# Patient Record
Sex: Female | Born: 1935 | Race: Black or African American | Hispanic: No | Smoking: Never smoker
Health system: Southern US, Community
[De-identification: ages and names within clinical notes are randomized; demographics above are authoritative.]

## PROBLEM LIST (undated history)

## (undated) DIAGNOSIS — K5730 Diverticulosis of large intestine without perforation or abscess without bleeding: Secondary | ICD-10-CM

## (undated) DIAGNOSIS — K219 Gastro-esophageal reflux disease without esophagitis: Secondary | ICD-10-CM

## (undated) HISTORY — PX: OOPHORECTOMY: SHX86

## (undated) HISTORY — DX: Diverticulosis of large intestine without perforation or abscess without bleeding: K57.30

## (undated) HISTORY — PX: ABDOMINAL HYSTERECTOMY: SHX81

## (undated) HISTORY — DX: Gastro-esophageal reflux disease without esophagitis: K21.9

---

## 2004-06-14 ENCOUNTER — Ambulatory Visit: Payer: Self-pay | Admitting: Internal Medicine

## 2004-07-09 ENCOUNTER — Ambulatory Visit: Payer: Self-pay | Admitting: Internal Medicine

## 2005-01-25 ENCOUNTER — Ambulatory Visit: Payer: Self-pay | Admitting: Internal Medicine

## 2005-10-07 ENCOUNTER — Ambulatory Visit: Payer: Self-pay | Admitting: Family

## 2005-11-19 ENCOUNTER — Ambulatory Visit: Payer: Self-pay | Admitting: Family Medicine

## 2005-12-30 ENCOUNTER — Ambulatory Visit: Payer: Self-pay | Admitting: Unknown Physician Specialty

## 2006-12-01 ENCOUNTER — Ambulatory Visit: Payer: Self-pay

## 2007-01-29 ENCOUNTER — Emergency Department: Payer: Self-pay | Admitting: Emergency Medicine

## 2007-01-29 ENCOUNTER — Other Ambulatory Visit: Payer: Self-pay

## 2007-03-16 ENCOUNTER — Ambulatory Visit: Payer: Self-pay | Admitting: Ophthalmology

## 2007-10-19 ENCOUNTER — Ambulatory Visit: Payer: Self-pay | Admitting: Internal Medicine

## 2008-01-11 ENCOUNTER — Ambulatory Visit: Payer: Self-pay | Admitting: Internal Medicine

## 2008-02-25 ENCOUNTER — Emergency Department: Payer: Self-pay | Admitting: Emergency Medicine

## 2008-02-25 ENCOUNTER — Other Ambulatory Visit: Payer: Self-pay

## 2008-04-04 ENCOUNTER — Ambulatory Visit: Payer: Self-pay | Admitting: Gastroenterology

## 2008-07-18 ENCOUNTER — Ambulatory Visit: Payer: Self-pay | Admitting: Internal Medicine

## 2008-09-28 ENCOUNTER — Emergency Department: Payer: Self-pay | Admitting: Emergency Medicine

## 2008-11-14 ENCOUNTER — Ambulatory Visit: Payer: Self-pay | Admitting: Internal Medicine

## 2008-11-28 ENCOUNTER — Ambulatory Visit: Payer: Self-pay | Admitting: Internal Medicine

## 2009-01-04 ENCOUNTER — Ambulatory Visit (HOSPITAL_COMMUNITY): Admission: RE | Admit: 2009-01-04 | Discharge: 2009-01-05 | Payer: Self-pay | Admitting: Neurosurgery

## 2009-04-03 ENCOUNTER — Ambulatory Visit: Payer: Self-pay | Admitting: Internal Medicine

## 2009-04-04 ENCOUNTER — Ambulatory Visit: Payer: Self-pay | Admitting: Internal Medicine

## 2009-04-24 ENCOUNTER — Encounter: Payer: Self-pay | Admitting: Internal Medicine

## 2009-05-21 ENCOUNTER — Encounter: Payer: Self-pay | Admitting: Internal Medicine

## 2010-06-07 ENCOUNTER — Ambulatory Visit: Payer: Self-pay | Admitting: Internal Medicine

## 2010-09-20 LAB — IFOBT (OCCULT BLOOD): IFOBT: NEGATIVE

## 2010-10-21 ENCOUNTER — Ambulatory Visit: Payer: Self-pay | Admitting: Gastroenterology

## 2010-10-21 LAB — HM COLONOSCOPY

## 2010-10-28 LAB — CBC
HCT: 39.4 % (ref 36.0–46.0)
Hemoglobin: 13.5 g/dL (ref 12.0–15.0)
MCHC: 34.2 g/dL (ref 30.0–36.0)
MCV: 91 fL (ref 78.0–100.0)
Platelets: 383 10*3/uL (ref 150–400)
RBC: 4.33 MIL/uL (ref 3.87–5.11)
RDW: 13.1 % (ref 11.5–15.5)
WBC: 7.7 10*3/uL (ref 4.0–10.5)

## 2010-11-04 ENCOUNTER — Ambulatory Visit: Payer: Self-pay | Admitting: Gastroenterology

## 2010-12-03 NOTE — Op Note (Signed)
NAME:  Bridget [name], [name] NO.:  [id number]   MEDICAL RECORD NO.:  [id number]          PATIENT TYPE:  OIB   LOCATION:  3599                         FACILITY:  [facility]   PHYSICIAN:  Hewitt Shorts, M.D.DATE OF BIRTH:  [birth year]   DATE OF PROCEDURE:  01/04/2009  DATE OF DISCHARGE:                               OPERATIVE REPORT   PREOPERATIVE DIAGNOSES:  1. Lumbar stenosis.  2. Lumbar spondylosis.  3. Lumbar degenerative disk disease.  4. Neurogenic claudication.   POSTOPERATIVE DIAGNOSES:  1. Lumbar stenosis.  2. Lumbar spondylosis.  3. Lumbar degenerative disk disease.  4. Neurogenic claudication.   PROCEDURES:  L3-S1 decompressive lumbar laminotomy with microdissection  with decompression of the L3, L4, L5, and S1 nerve roots bilaterally.   SURGEON:  Hewitt Shorts, MD   ASSISTANT:  Nelia Shi. Webb Silversmith, RN and Coletta Memos, MD   ANESTHESIA:  General endotracheal.   INDICATIONS:  The patient is a 77 year old woman who presented with  neurogenic claudication.  She was found to have 6 lumbar type vertebra,  thoracic x-ray confirmed lumbarization of S1.  She had advanced  degenerative disk disease of spondylosis to the lumbar spine with  resulting stenosis at L3-4, L4-5, and L5-S1 multifactorial nature, she  had a mild grade 1 degenerative spondylolisthesis of L4 and L5, and the  decision was made to proceed with decompressive lumbar laminectomy.   PROCEDURE IN DETAIL:  The patient was brought to the operating room and  placed under general endotracheal anesthesia.  The patient was turned to  a prone position.  Lumbar region was prepped with Betadine soap and  solution and draped in a sterile fashion.  The midline was infiltrated  with local anesthetic with epinephrine and midline incision made carried  down through the subcutaneous tissue.  Bipolar cautery and  electrocautery were used to maintain hemostasis.  Dissection was carried  down to the lumbar  fascia, which was incised bilaterally, and the  paraspinal musculature was dissected from the spinous process in a  subperiosteal fashion.  Self-retaining retractors were placed and an x-  ray taken.  The L3, L4, and L5 spinous process and lamina were  identified.   We proceeded with decompression using magnification with microdissection  and microsurgical technique.  Laminectomy was performed using double-  action rongeurs, the Stryker high-speed drill, and Kerrison punches.  There was marked thickening of the ligamentum flavum at each level at  several points was adherent to the dura and was carefully dissected from  the dural adhesions and the thickened ligamentum flavum was removed and  the thecal sac decompressed.  Decompression was carried out laterally  into the neuroforamen using up-angled curettes to scrap the thickened  ligamentum flavum away from this lateral thecal sac and nerve roots.  Then, we were able to decompress the exceeding L3, L4, L5, and S1 nerve  roots bilaterally.  Once the decompression was completed, hemostasis was  established.  We placed Gelfoam with thrombin in the laminectomy defect.  The wound was irrigated numerous times during the procedure initially  with saline solution and subsequently with Bacitracin  solution.  Good  hemostasis was achieved and then once decompression was completed,  hemostasis was established, we proceeded with closure.  Paraspinal  musculature was approximated with interrupted undyed 1 Vicryl sutures.  The deep fascia was closed with interrupted undyed 1 Vicryl suture.  Scarpa fascia was closed with interrupted undyed 1 Vicryl sutures.  The  subcutaneous and subcuticular were closed with interrupted inverted 2-0  undyed Vicryl sutures.  The skin edges were closed with surgical  staples.  The wound was dressed with Adaptic and sterile gauze.  The  procedure was tolerated well.  The estimated blood loss was 100 mL.  Sponge and needle  count were correct.  Following surgery, the patient  was returned back to the supine position, to be reversed from the  anesthetic, extubated, and transferred to the recovery room for further  care.  In the recovery room, she was noted to be moving all 4  extremities to command.      Hewitt Shorts, M.D.  Electronically Signed     RWN/MEDQ  D:  01/04/2009  T:  01/05/2009  Job:  [id number]

## 2011-03-04 ENCOUNTER — Encounter: Payer: Self-pay | Admitting: Internal Medicine

## 2011-03-31 ENCOUNTER — Telehealth: Payer: Self-pay | Admitting: Internal Medicine

## 2011-03-31 DIAGNOSIS — M8580 Other specified disorders of bone density and structure, unspecified site: Secondary | ICD-10-CM

## 2011-03-31 NOTE — Telephone Encounter (Signed)
Pt walked in wanting to get a bone density done she said the last time she saw dr Darrick Huntsman at [facility] dr Darrick Huntsman wanted her to get one.

## 2011-04-01 NOTE — Telephone Encounter (Signed)
dexa scan ordered but not printed.  Can you print?

## 2011-04-01 NOTE — Telephone Encounter (Signed)
Patient is requesting her bone density to be ordered.  She stated at her last office visit you told her it was time to have one.  Please advise

## 2011-04-02 ENCOUNTER — Other Ambulatory Visit (INDEPENDENT_AMBULATORY_CARE_PROVIDER_SITE_OTHER): Payer: Medicare Other | Admitting: *Deleted

## 2011-04-02 ENCOUNTER — Telehealth: Payer: Self-pay | Admitting: *Deleted

## 2011-04-02 DIAGNOSIS — E559 Vitamin D deficiency, unspecified: Secondary | ICD-10-CM

## 2011-04-02 DIAGNOSIS — E785 Hyperlipidemia, unspecified: Secondary | ICD-10-CM

## 2011-04-02 NOTE — Telephone Encounter (Signed)
Message copied by Jobie Quaker on Wed Apr 02, 2011  5:42 PM ------      Message from: Melody Comas L      Created: Wed Apr 02, 2011  8:30 AM      Regarding: Labs       Patient came in this morning for labs. She says that she had her cpx at [facility] and was told to come here for labs. She says that you were wanting to check her cholesterol and kidney function. She is not a diabetic. What would you like for me to order?

## 2011-04-02 NOTE — Telephone Encounter (Signed)
Yes, order has been printed and waiting on signature.

## 2011-04-03 NOTE — Telephone Encounter (Signed)
Ordered

## 2011-04-03 NOTE — Telephone Encounter (Signed)
She needs fasting lipids, vitam D 25 ohD and CMET  Use 272.4  268.9 and V58.60 for the codes,.  Thank you

## 2011-04-04 LAB — COMPREHENSIVE METABOLIC PANEL
ALT: 13 U/L (ref 0–35)
AST: 20 U/L (ref 0–37)
Albumin: 4.3 g/dL (ref 3.5–5.2)
Alkaline Phosphatase: 57 U/L (ref 39–117)
BUN: 10 mg/dL (ref 6–23)
Calcium: 9.7 mg/dL (ref 8.4–10.5)
Chloride: 105 mEq/L (ref 96–112)
Potassium: 4.2 mEq/L (ref 3.5–5.3)
Sodium: 142 mEq/L (ref 135–145)
Total Protein: 6.9 g/dL (ref 6.0–8.3)

## 2011-04-04 LAB — LIPID PANEL
LDL Cholesterol: 100 mg/dL — ABNORMAL HIGH (ref 0–99)
Triglycerides: 87 mg/dL (ref <150)
VLDL: 17 mg/dL (ref 0–40)

## 2011-04-07 ENCOUNTER — Encounter: Payer: Self-pay | Admitting: Internal Medicine

## 2011-05-13 ENCOUNTER — Telehealth: Payer: Self-pay | Admitting: *Deleted

## 2011-05-13 NOTE — Telephone Encounter (Signed)
Spoke w/patient. She c/o chest pain in her "breast bone" off and x 3 to 4 days. Recently treated by UC for sinusitis and given antibiotic which she finished a few days ago. She also has non-productive cough. Pain is relieved when she puts pressure on the area. NO fever, chills, body aches, wheezing, SOB, chest pressure, lightheadedness/dizziness or any other complaints. Advised ER or call and speak w/on call service with increase/change in symptoms - pt agreed. She is scheduled for OV tomorrow AM for eval.

## 2011-05-15 ENCOUNTER — Ambulatory Visit (INDEPENDENT_AMBULATORY_CARE_PROVIDER_SITE_OTHER): Payer: Medicare Other | Admitting: Internal Medicine

## 2011-05-15 ENCOUNTER — Encounter: Payer: Self-pay | Admitting: Internal Medicine

## 2011-05-15 DIAGNOSIS — R079 Chest pain, unspecified: Secondary | ICD-10-CM | POA: Insufficient documentation

## 2011-05-15 DIAGNOSIS — J0110 Acute frontal sinusitis, unspecified: Secondary | ICD-10-CM | POA: Insufficient documentation

## 2011-05-15 MED ORDER — ESOMEPRAZOLE MAGNESIUM 40 MG PO CPDR: 40.0000 mg | DELAYED_RELEASE_CAPSULE | Freq: Every day | ORAL | Status: DC

## 2011-05-15 MED ORDER — AMOXICILLIN-POT CLAVULANATE 875-125 MG PO TABS: 1.0000 | ORAL_TABLET | Freq: Two times a day (BID) | ORAL | Status: AC

## 2011-05-15 NOTE — Patient Instructions (Addendum)
Ty using OTC Lotrimin on a q tip to clean your ears daily.  Tell Merton Border to get Debrox (Liquid ear drop for eax removal) to use every night.  We are going to treat your sinuses with an antibiotic and your reflux with Nexium . Marland Kitchen  If your chest pain does  not resolve in a week,  We will set you up for a stress test.  Try Delsym cough syrup for your cough

## 2011-05-15 NOTE — Progress Notes (Signed)
  Subjective:    Patient ID: Bridget [name], female    DOB: Oct 06, 1930, 77 y.o.   MRN: [id number]  HPI  77 yo AA female with history of  GERD, osteopenia, presents with history of chest pain which started 2 or 3 weeks ago concurrent with sinus congestion and occasional cough productive of yellow sputum.  No fevers, or SOB.  Pain comes and goes eases off with belching or rubbing her chest/sternum.  No pleurisy or pain with cough.  She had one episode of sharp chest pain which radiated to back, which occurred while at rest.  She denies any episodes of chest pain  During exercise on her treadmill which she uses daily for an hour.    Review of Systems  Constitutional: Negative for fever, chills and unexpected weight change.  HENT: Positive for congestion and postnasal drip. Negative for hearing loss, ear pain, nosebleeds, sore throat, facial swelling, rhinorrhea, sneezing, mouth sores, trouble swallowing, neck pain, neck stiffness, voice change, sinus pressure, tinnitus and ear discharge.   Eyes: Negative for pain, discharge, redness and visual disturbance.  Respiratory: Positive for cough. Negative for chest tightness, shortness of breath, wheezing and stridor.   Cardiovascular: Negative for chest pain, palpitations and leg swelling.  Musculoskeletal: Negative for myalgias and arthralgias.  Skin: Negative for color change and rash.  Neurological: Negative for dizziness, weakness, light-headedness and headaches.  Hematological: Negative for adenopathy.       Objective:   Physical Exam  Constitutional: She is oriented to person, place, and time. She appears well-developed and well-nourished.  HENT:  Mouth/Throat: Oropharynx is clear and moist.  Eyes: EOM are normal. Pupils are equal, round, and reactive to light. No scleral icterus.  Neck: Normal range of motion. Neck supple. No JVD present. No thyromegaly present.  Cardiovascular: Normal rate, regular rhythm, normal heart sounds and intact  distal pulses.   Pulmonary/Chest: Effort normal and breath sounds normal.  Abdominal: Soft. Bowel sounds are normal. She exhibits no mass. There is no tenderness.  Musculoskeletal: Normal range of motion. She exhibits no edema.  Lymphadenopathy:    She has no cervical adenopathy.  Neurological: She is alert and oriented to person, place, and time.  Skin: Skin is warm and dry.  Psychiatric: She has a normal mood and affect.          Assessment & Plan:  Sinusitis;  Will treat cough, congestion with augmentin.  Chest pain:  Atypical with no risk factors for CAD other than age.  Likely reflux,  Trial of Nexium.

## 2011-05-18 ENCOUNTER — Encounter: Payer: Self-pay | Admitting: Internal Medicine

## 2011-05-18 DIAGNOSIS — K219 Gastro-esophageal reflux disease without esophagitis: Secondary | ICD-10-CM | POA: Insufficient documentation

## 2011-06-02 ENCOUNTER — Telehealth: Payer: Self-pay | Admitting: Internal Medicine

## 2011-06-02 DIAGNOSIS — R0789 Other chest pain: Secondary | ICD-10-CM

## 2011-06-02 NOTE — Telephone Encounter (Signed)
Patient is having chest pains on and off .

## 2011-06-02 NOTE — Telephone Encounter (Signed)
Patient called and stated she is still having chest pains that she saw you about 2 weeks ago.  She stated you told her to call back if the pains did not resolved with the use of Nexium.  She stated the pains are not consistent, they are just on and off.  I looked back in her chart and you said we would set her up with a stress test if the pains continued.  She said she would do whatever you wanted her too.  Please advise.

## 2011-06-03 NOTE — Telephone Encounter (Signed)
Patient notified. She will wait to here from [facility] with her appt.

## 2011-06-03 NOTE — Telephone Encounter (Signed)
Yes I have placed referral to cardiology in EPIC for Dr. Tild[facility]

## 2011-06-23 ENCOUNTER — Ambulatory Visit: Payer: Medicare Other | Admitting: Cardiovascular Disease

## 2011-07-09 ENCOUNTER — Ambulatory Visit (INDEPENDENT_AMBULATORY_CARE_PROVIDER_SITE_OTHER): Payer: Medicare Other | Admitting: Cardiovascular Disease

## 2011-07-09 ENCOUNTER — Encounter: Payer: Self-pay | Admitting: Cardiovascular Disease

## 2011-07-09 DIAGNOSIS — R079 Chest pain, unspecified: Secondary | ICD-10-CM

## 2011-07-09 DIAGNOSIS — K219 Gastro-esophageal reflux disease without esophagitis: Secondary | ICD-10-CM

## 2011-07-09 NOTE — Progress Notes (Signed)
Exercise Treadmill Test   Treadmill ordered for recent epsiodes of chest pain.  Resting EKG shows NSR with rate of 63 bpm Resting blood pressure of 128/72 Stand bruce protocal was used.  Patient exercised for 5:39 min Peak heart rate of 152 bpm.  This was 107% of the maximum predicted heart rate (target heart rate 142). No symptoms of chest pain or lightheadedness were reported at peak stress or in recovery.  Peak Blood pressure recorded was 158/82 Heart rate at 3 minutes in recovery was 70bpm.  FINAL IMPRESSION: Normal exercise stress test. No significant EKG changes concerning for ischemia. Excellent exercise tolerance.

## 2011-07-09 NOTE — Progress Notes (Signed)
   Patient ID: Bridget [name], female    DOB: [birth year], 77 y.o.   MRN: [id number]  HPI Comments: Bridget [name] is a very pleasant 77 year old woman with history of GERD, presenting with recent chest pain.   She reports that over the past 2-3 months she has had waxing waning episodes of chest pain. She has 1-2 episodes per week, lasting for less than a minute at a time. Sometimes it occurs on a treadmill, other times at rest. She is uncertain that her symptoms seemed to start when she first developed an upper respiratory infection in October. She was seen in urgent care and then by Dr. Darrick Huntsman. Symptoms seemed to resolve on antibiotics.  She does go to the gym with her husband everyday and is on a treadmill for up to one hour at a slow rate. Sometimes with chest discomfort of typically does well with exertion. No significant new shortness of breath, no edema, no lightheadedness or dizziness.  In terms of her family history, father died from a stroke at age 69, brother of an aneurysm.  EKG shows normal sinus rhythm with rate 63 beats per minute with left axis deviation   Outpatient Encounter Prescriptions as of 07/09/2011  Medication Sig Dispense Refill  . acetaminophen (TYLENOL) 325 MG tablet Take 325 mg by mouth as needed.        . Calcium Carbonate-Vitamin D (CALCIUM + D) 600-200 MG-UNIT TABS Take 1 tablet by mouth daily.        Marland Kitchen esomeprazole (NEXIUM) 40 MG capsule Take 1 capsule (40 mg total) by mouth daily.  30 capsule  1  . Multiple Vitamins-Minerals (CENTRUM SILVER PO) Take 1 tablet by mouth daily.          Review of Systems  Constitutional: Negative.   HENT: Negative.   Eyes: Negative.   Respiratory: Negative.   Cardiovascular: Positive for chest pain.  Gastrointestinal: Negative.   Musculoskeletal: Negative.   Skin: Negative.   Neurological: Negative.   Hematological: Negative.   Psychiatric/Behavioral: Negative.   All other systems reviewed and are negative.    BP 110/82   Pulse 63  Ht 5\' 5"  (1.651 m)  Wt 163 lb (73.936 kg)  BMI 27.12 kg/m2  Physical Exam  Nursing note and vitals reviewed. Constitutional: She is oriented to person, place, and time. She appears well-developed and well-nourished.  HENT:  Head: Normocephalic.  Nose: Nose normal.  Mouth/Throat: Oropharynx is clear and moist.  Eyes: Conjunctivae are normal. Pupils are equal, round, and reactive to light.  Neck: Normal range of motion. Neck supple. No JVD present.  Cardiovascular: Normal rate, regular rhythm, S1 normal, S2 normal, normal heart sounds and intact distal pulses.  Exam reveals no gallop and no friction rub.   No murmur heard. Pulmonary/Chest: Effort normal and breath sounds normal. No respiratory distress. She has no wheezes. She has no rales. She exhibits no tenderness.  Abdominal: Soft. Bowel sounds are normal. She exhibits no distension. There is no tenderness.  Musculoskeletal: Normal range of motion. She exhibits no edema and no tenderness.  Lymphadenopathy:    She has no cervical adenopathy.  Neurological: She is alert and oriented to person, place, and time. Coordination normal.  Skin: Skin is warm and dry. No rash noted. No erythema.  Psychiatric: She has a normal mood and affect. Her behavior is normal. Judgment and thought content normal.         Assessment and Plan

## 2011-07-09 NOTE — Assessment & Plan Note (Signed)
Symptoms are somewhat atypical though occasionally do come on with treadmill.  We will set her up for a treadmill stress today to risk stratify. She has few risk factors.

## 2011-07-09 NOTE — Patient Instructions (Signed)
Stress test is normal and does not show any significant ischemia. Chest pain is likely noncardiac in nature given the results from today.

## 2011-07-09 NOTE — Assessment & Plan Note (Signed)
If her treadmill study is normal, symptoms could be secondary to GI etiology.

## 2011-07-09 NOTE — Patient Instructions (Addendum)
You are doing well. No medication changes were made. We will have you complete the treadmill today.  Please call us if you have new issues that need to be addressed before your next appt.

## 2011-07-28 LAB — HM DEXA SCAN

## 2011-07-31 ENCOUNTER — Ambulatory Visit: Payer: Self-pay | Admitting: Internal Medicine

## 2011-08-01 ENCOUNTER — Encounter: Payer: Self-pay | Admitting: Internal Medicine

## 2011-08-01 ENCOUNTER — Telehealth: Payer: Self-pay | Admitting: Internal Medicine

## 2011-08-01 DIAGNOSIS — M8580 Other specified disorders of bone density and structure, unspecified site: Secondary | ICD-10-CM | POA: Insufficient documentation

## 2011-08-01 NOTE — Telephone Encounter (Signed)
Her bone density test was normal except at her forearms. She had slight bone loss there but not enough to change therapy.  Would recommend using low wieghts (2 or 5 lbs) to strengthen arms.

## 2011-08-04 NOTE — Telephone Encounter (Signed)
Spoke with patient and gave results. 

## 2011-08-27 ENCOUNTER — Other Ambulatory Visit: Payer: Self-pay | Admitting: Internal Medicine

## 2011-09-23 ENCOUNTER — Encounter: Payer: Self-pay | Admitting: Internal Medicine

## 2011-10-14 ENCOUNTER — Telehealth: Payer: Self-pay | Admitting: Internal Medicine

## 2011-10-14 NOTE — Telephone Encounter (Signed)
Caller: Bridget [name]; PCP: Duncan Dull; CB#: [phone]; ; ; Call regarding Leg Pain;   Patient states she developed  area of mild swelling and burning pain in anterior left lower leg, below knee. States bluish area note on lower leg, approx. the size of pencil eraser.  States onset of sx 10/12/11. Afebrile. States pain has become more severe. States, "it woke me up last night." Triage per Skin lesions Protocol. No emergent sx identified. Care advice given per guidelines. Patient advised elevation, local heat. No appts. available in Epic. RN spoke with Paulino Rily in office. Informed of above. Orders received: Patient to go to UC for evaluation.  Patient advised of above, patient advised not to drive self. Patient verbalizes understanding and agreeable. Patient agrees to go to [facility] UC now for evaluation.

## 2011-10-14 NOTE — Telephone Encounter (Signed)
[facility] Triage Call Report Triage Record Num: [id number] Operator: Patriciaann Clan Patient Name: Bridget [name] Call Date & Time: 10/14/2011 10:48:20AM Patient Phone: [phone] PCP: Duncan Dull Patient Gender: Female PCP Fax : [phone] Patient DOB: [birth year] Practice Name: [facility] Reason for Call: Caller: Tyresha/Patient; PCP: Duncan Dull; CB#: [phone]; ; ; Call regarding Leg Pain; Patient states she developed area of mild swelling and burning pain in anterior left lower leg, below knee. States bluish area note on lower leg, approx. the size of pencil eraser. States onset of sx 10/12/11. Afebrile. States pain has become more severe. States, "it woke me up last night." Triage per Skin lesions Protocol. No emergent sx identified. Care advice given per guidelines. Patient advised elevation, local heat. No appts. available in Epic. RN spoke with Paulino Rily in office. Informed of above. Orders received: Patient to go to UC for evaluation. Patient advised of above, patient advised not to drive self. Patient verbalizes understanding and agreeable. Patient agrees to go to [facility] UC now for evaluation. Protocol(s) Used: Skin Lesions Recommended Outcome per Protocol: See Provider within 4 hours Reason for Outcome: Any skin lesion with signs and symptoms of worsening infection (quickly getting larger, becoming more painful, purulent drainage, new fever not improving with treatment) Care Advice: ~ SYMPTOM / CONDITION MANAGEMENT Apply local moist heat (such as a warm, wet wash cloth covered with plastic wrap) to the area for 15-20 minutes every 2-3 hours while awake. ~ 10/14/2011 11:09:14AM Page 1 of 1 [id number]

## 2011-12-09 ENCOUNTER — Telehealth: Payer: Self-pay | Admitting: Internal Medicine

## 2011-12-09 DIAGNOSIS — Z1239 Encounter for other screening for malignant neoplasm of breast: Secondary | ICD-10-CM

## 2011-12-09 NOTE — Telephone Encounter (Signed)
Patient is needing an order for a mammogram screening.

## 2011-12-10 NOTE — Telephone Encounter (Signed)
Addended by: Duncan Dull on: 12/10/2011 04:51 PM   Modules accepted: Orders

## 2011-12-26 LAB — HM MAMMOGRAPHY: HM Mammogram: NORMAL

## 2012-01-09 ENCOUNTER — Ambulatory Visit: Payer: Self-pay | Admitting: Internal Medicine

## 2012-01-19 ENCOUNTER — Encounter: Payer: Self-pay | Admitting: Internal Medicine

## 2012-02-25 ENCOUNTER — Ambulatory Visit (INDEPENDENT_AMBULATORY_CARE_PROVIDER_SITE_OTHER): Payer: Medicare Other | Admitting: Internal Medicine

## 2012-02-25 ENCOUNTER — Ambulatory Visit: Payer: Self-pay | Admitting: Internal Medicine

## 2012-02-25 ENCOUNTER — Encounter: Payer: Self-pay | Admitting: Internal Medicine

## 2012-02-25 VITALS — BP 114/72 | HR 70 | Temp 98.0°F | Resp 16 | Ht 64.0 in | Wt 158.5 lb

## 2012-02-25 DIAGNOSIS — N951 Menopausal and female climacteric states: Secondary | ICD-10-CM | POA: Insufficient documentation

## 2012-02-25 DIAGNOSIS — E785 Hyperlipidemia, unspecified: Secondary | ICD-10-CM

## 2012-02-25 DIAGNOSIS — R5383 Other fatigue: Secondary | ICD-10-CM

## 2012-02-25 DIAGNOSIS — Z1231 Encounter for screening mammogram for malignant neoplasm of breast: Secondary | ICD-10-CM | POA: Insufficient documentation

## 2012-02-25 DIAGNOSIS — Z0000 Encounter for general adult medical examination without abnormal findings: Secondary | ICD-10-CM

## 2012-02-25 DIAGNOSIS — R5381 Other malaise: Secondary | ICD-10-CM

## 2012-02-25 DIAGNOSIS — R61 Generalized hyperhidrosis: Secondary | ICD-10-CM

## 2012-02-25 DIAGNOSIS — Z23 Encounter for immunization: Secondary | ICD-10-CM

## 2012-02-25 DIAGNOSIS — M898X9 Other specified disorders of bone, unspecified site: Secondary | ICD-10-CM

## 2012-02-25 LAB — CBC WITH DIFFERENTIAL/PLATELET
Basophils Absolute: 0 10*3/uL (ref 0.0–0.1)
Eosinophils Relative: 3.1 % (ref 0.0–5.0)
HCT: 39.9 % (ref 36.0–46.0)
Lymphs Abs: 1.9 10*3/uL (ref 0.7–4.0)
Monocytes Relative: 7.4 % (ref 3.0–12.0)
Neutrophils Relative %: 57.2 % (ref 43.0–77.0)
Platelets: 341 10*3/uL (ref 150.0–400.0)
RDW: 13.6 % (ref 11.5–14.6)
WBC: 6.1 10*3/uL (ref 4.5–10.5)

## 2012-02-25 LAB — COMPREHENSIVE METABOLIC PANEL
Albumin: 4.4 g/dL (ref 3.5–5.2)
Alkaline Phosphatase: 50 U/L (ref 39–117)
BUN: 11 mg/dL (ref 6–23)
Calcium: 10.1 mg/dL (ref 8.4–10.5)
Chloride: 104 mEq/L (ref 96–112)
Creatinine, Ser: 0.8 mg/dL (ref 0.4–1.2)
Glucose, Bld: 91 mg/dL (ref 70–99)
Potassium: 3.8 mEq/L (ref 3.5–5.1)

## 2012-02-25 LAB — LIPID PANEL
Cholesterol: 184 mg/dL (ref 0–200)
Triglycerides: 88 mg/dL (ref 0.0–149.0)
VLDL: 17.6 mg/dL (ref 0.0–40.0)

## 2012-02-25 LAB — TSH: TSH: 0.92 u[IU]/mL (ref 0.35–5.50)

## 2012-02-25 NOTE — Patient Instructions (Addendum)
You received the pneumonia vaccine today  If your arm becomes sore, you may take ibuprofen or tylenol for the pain    We will call you with the results of your blood tests

## 2012-02-25 NOTE — Progress Notes (Signed)
Patient ID: Bridget [name], female   DOB: 1931/05/29, 78 y.o.   MRN: [id number] The patient is here for annual Medicare wellness examination and management of other chronic and acute problems.  Her last colonoscopy was 2007.   The risk factors are reflected in the social history.  The roster of all physicians providing medical care to patient - is listed in the Snapshot section of the chart.  Activities of daily living:  The patient is 100% independent in all ADLs: dressing, toileting, feeding as well as independent mobility  Home safety : The patient has smoke detectors in the home. They wear seatbelts.  There are no firearms at home. There is no violence in the home.   There is no risks for hepatitis, STDs or HIV. There is no   history of blood transfusion. They have no travel history to infectious disease endemic areas of the world.  The patient has not seen their dentist in the last six month. They have not seen their eye doctor in the last year. They admit to no hearing difficulty with regard to whispered voices and some television programs.  They have deferred audiologic testing in the last year.  They do not  have excessive sun exposure. Discussed the need for sun protection: hats, long sleeves and use of sunscreen if there is significant sun exposure.   Diet: the importance of a healthy diet is discussed. They do have a healthy diet.  The benefits of regular aerobic exercise were discussed. She walks 4 times per week ,  20 minutes.   Depression screen: there are no signs or vegative symptoms of depression- irritability, change in appetite, anhedonia, sadness/tearfullness.  Cognitive assessment: the patient manages all their financial and personal affairs and is actively engaged. They could relate day,date,year and events; recalled 2/3 objects at 3 minutes; performed clock-face test normally.  The following portions of the patient's history were reviewed and updated as appropriate:  allergies, current medications, past family history, past medical history,  past surgical history, past social history  and problem list.  Visual acuity was not assessed per patient preference since she has regular follow up with her ophthalmologist. Hearing and body mass index were assessed and reviewed.   During the course of the visit the patient was educated and counseled about appropriate screening and preventive services including : fall prevention , diabetes screening, nutrition counseling, colorectal cancer screening, and recommended immunizations.    OBJECTIVE:  BP 114/72  Pulse 70  Temp 98 F (36.7 C) (Oral)  Resp 16  Ht 5\' 4"  (1.626 m)  Wt 158 lb 8 oz (71.895 kg)  BMI 27.21 kg/m2  SpO2 95%  General appearance: alert, cooperative and appears stated age Ears: normal TM's and external ear canals both ears Throat: lips, mucosa, and tongue normal; teeth and gums normal Neck: no adenopathy, no carotid bruit, supple, symmetrical, trachea midline and thyroid not enlarged, symmetric, no tenderness/mass/nodules Breasts:  Asymmetric, no masses nipple inversion or discharge Back: symmetric, no curvature. ROM normal. No CVA tenderness. Lungs: clear to auscultation bilaterally Heart: regular rate and rhythm, S1, S2 normal, no murmur, click, rub or gallop Abdomen: soft, non-tender; bowel sounds normal; no masses,  no organomegaly Pulses: 2+ and symmetric Skin: Skin color, texture, turgor normal. No rashes or lesions Lymph nodes: Cervical, supraclavicular, and axillary nodes normal.  Routine general medical examination at a health care facility Comprehensive medical exam without pelvic exam was done today.  She was counseled to get a T.daP vaccine  at the health department. Pneumonia vaccine was given today. She is up-to-date on all screening.   Updated Medication List Outpatient Encounter Prescriptions as of 02/25/2012  Medication Sig Dispense Refill  . acetaminophen (TYLENOL) 325 MG  tablet Take 325 mg by mouth as needed.        . Calcium Carbonate-Vitamin D (CALCIUM + D) 600-200 MG-UNIT TABS Take 1 tablet by mouth daily.        . Multiple Vitamins-Minerals (CENTRUM SILVER PO) Take 1 tablet by mouth daily.        Marland Kitchen omeprazole (PRILOSEC) 40 MG capsule TAKE ONE CAPSULE BY MOUTH EVERY DAY  30 capsule  11  . DISCONTD: esomeprazole (NEXIUM) 40 MG capsule Take 1 capsule (40 mg total) by mouth daily.  30 capsule  1

## 2012-02-26 ENCOUNTER — Encounter: Payer: Self-pay | Admitting: Internal Medicine

## 2012-02-26 DIAGNOSIS — K5730 Diverticulosis of large intestine without perforation or abscess without bleeding: Secondary | ICD-10-CM | POA: Insufficient documentation

## 2012-02-26 LAB — VITAMIN D 25 HYDROXY (VIT D DEFICIENCY, FRACTURES): Vit D, 25-Hydroxy: 39 ng/mL (ref 30–89)

## 2012-02-26 NOTE — Assessment & Plan Note (Signed)
Comprehensive medical exam without pelvic exam was done today.

## 2012-03-01 ENCOUNTER — Telehealth: Payer: Self-pay | Admitting: Internal Medicine

## 2012-03-01 NOTE — Telephone Encounter (Signed)
Patient notified

## 2012-03-01 NOTE — Telephone Encounter (Signed)
The x-rays of her right lower leg showed no fractures or malignancies, but there were lots of arthritis changes which might be causing her pain. She can try taking Aleve once or twice daily as needed if tolerated.

## 2012-03-09 ENCOUNTER — Encounter: Payer: Self-pay | Admitting: Internal Medicine

## 2012-03-16 ENCOUNTER — Telehealth: Payer: Self-pay | Admitting: Internal Medicine

## 2012-03-16 NOTE — Telephone Encounter (Signed)
Patient wanting a copy of blood work from 8.7.13 mailed to her.

## 2012-03-16 NOTE — Telephone Encounter (Signed)
Labs mailed

## 2012-03-18 ENCOUNTER — Encounter: Payer: Self-pay | Admitting: Internal Medicine

## 2012-04-20 ENCOUNTER — Telehealth: Payer: Self-pay | Admitting: Internal Medicine

## 2012-04-20 NOTE — Telephone Encounter (Signed)
Patient wants to switch from Omeprazole 40 mg to Pantoprazole 40 mg

## 2012-04-21 MED ORDER — PANTOPRAZOLE SODIUM 40 MG PO TBEC: 40.0000 mg | DELAYED_RELEASE_TABLET | Freq: Every day | ORAL | Status: DC

## 2012-04-21 NOTE — Telephone Encounter (Signed)
OK to change?

## 2012-04-21 NOTE — Telephone Encounter (Signed)
Yes, new rx sent to [facility]

## 2012-04-22 NOTE — Telephone Encounter (Signed)
Message left notifying patient.

## 2012-08-23 ENCOUNTER — Other Ambulatory Visit: Payer: Self-pay | Admitting: Internal Medicine

## 2012-08-23 NOTE — Telephone Encounter (Signed)
Med filled.  

## 2012-11-21 ENCOUNTER — Other Ambulatory Visit: Payer: Self-pay | Admitting: Internal Medicine

## 2013-01-19 ENCOUNTER — Ambulatory Visit (INDEPENDENT_AMBULATORY_CARE_PROVIDER_SITE_OTHER): Payer: Medicare Other | Admitting: Internal Medicine

## 2013-01-19 ENCOUNTER — Encounter: Payer: Self-pay | Admitting: Internal Medicine

## 2013-01-19 VITALS — BP 132/82 | HR 61 | Temp 98.1°F | Resp 16 | Wt 156.2 lb

## 2013-01-19 DIAGNOSIS — R3911 Hesitancy of micturition: Secondary | ICD-10-CM

## 2013-01-19 DIAGNOSIS — K219 Gastro-esophageal reflux disease without esophagitis: Secondary | ICD-10-CM

## 2013-01-19 LAB — URINALYSIS
Hgb urine dipstick: NEGATIVE
Ketones, ur: NEGATIVE
Urine Glucose: NEGATIVE
Urobilinogen, UA: 0.2 (ref 0.0–1.0)

## 2013-01-19 MED ORDER — DEXLANSOPRAZOLE 60 MG PO CPDR: 60.0000 mg | DELAYED_RELEASE_CAPSULE | Freq: Every day | ORAL | Status: DC

## 2013-01-19 NOTE — Patient Instructions (Addendum)
We are ordering a bladder study to see who much you are retaining.   I will check your urine for infection .   If both tests are normal.  We will try the Flomax medication  I have given you samples of Dexilant to try for 2 weeks instead of pantoprazole.  If it controls your heartburn,  We will change your medication permanently.  If it does not, we will need to evaluate your gallbladder.

## 2013-01-19 NOTE — Progress Notes (Signed)
Patient ID: Bridget [name], female   DOB: 1931/04/27, 79 y.o.   MRN: [id number]  Patient Active Problem List   Diagnosis Date Noted  . Urinary hesitancy 01/22/2013  . Diverticulosis of colon without diverticulitis   . Menopausal sweats 02/25/2012  . Routine general medical examination at a health care facility 02/25/2012  . Osteopenia 08/01/2011  . GERD (gastroesophageal reflux disease)     Subjective:  CC:   Chief Complaint  Patient presents with  . Follow-up    6 month/ frequent urge to urinate but has difficulty eympting bladder.    HPI:   Bridget [name] a 79 y.o. female who presents Follow up on chronic medical conditions and subacute issues.   Cc: Bladder problems.  Patient reports having a slow stream along with frequent nights of  nocturia x 2 .  After voiding she frequently has the need to void again a small amount, usually  occuring about 15 minutes after emptying bladder.  Symptoms have been present for around 6 months  GERD :  Switched from omeprazole to pantoprazole several months ago because of insurance non coverage and symptoms are not controlled.    Past Medical History  Diagnosis Date  . GERD (gastroesophageal reflux disease)   . Diverticulosis of colon without diverticulitis     Past Surgical History  Procedure Laterality Date  . Oophorectomy      Bilateral  . Abdominal hysterectomy      Not due to cancer, due to fibroids, BSO       The following portions of the patient's history were reviewed and updated as appropriate: Allergies, current medications, and problem list.    Review of Systems:   Patient denies headache, fevers, malaise, unintentional weight loss, skin rash, eye pain, sinus congestion and sinus pain, sore throat, dysphagia,  hemoptysis , cough, dyspnea, wheezing, chest pain, palpitations, orthopnea, edema, abdominal pain, nausea, melena, diarrhea, constipation, flank pain, dysuria, hematuria, urinary  Frequency, nocturia,  numbness, tingling, seizures,  Focal weakness, Loss of consciousness,  Tremor, insomnia, depression, anxiety, and suicidal ideation.     History   Social History  . Marital Status: Married    Spouse Name: N/A    Number of Children: N/A  . Years of Education: N/A   Occupational History  . 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 Smoking status: Never Smoker   . Smokeless tobacco: Never Used  . Alcohol Use: No  . Drug Use: No  . Sexually Active: Not on file   Other Topics Concern  . Not on file   Social History Narrative  . No narrative on file    Objective:  BP 132/82  Pulse 61  Temp(Src) 98.1 F (36.7 C) (Oral)  Resp 16  Wt 156 lb 4 oz (70.875 kg)  BMI 26.81 kg/m2  SpO2 98%  General appearance: alert, cooperative and appears stated age Ears: normal TM's and external ear canals both ears Throat: lips, mucosa, and tongue normal; teeth and gums normal Neck: no adenopathy, no carotid bruit, supple, symmetrical, trachea midline and thyroid not enlarged, symmetric, no tenderness/mass/nodules Back: symmetric, no curvature. ROM normal. No CVA tenderness. Lungs: clear to auscultation bilaterally Heart: regular rate and rhythm, S1, S2 normal, no murmur, click, rub or gallop Abdomen: soft, non-tender; bowel sounds normal; no masses,  no organomegaly Pulses: 2+ and symmetric Skin: Skin color, texture, turgor normal. No rashes or lesions Lymph nodes: Cervical, supraclavicular, and  axillary nodes normal.  Assessment and Plan:  Urinary hesitancy She has no signs of infection on today's UA. Abdominal exam is negative for distension, masses or tenderness.  Will order a bladder ultrasound with a post void residual to rule out neurogenic bladder.  GERD (gastroesophageal reflux disease) Symptoms are not controlled with protonoix.  Trial of dexilant.  Samples given    Updated Medication List Outpatient Encounter Prescriptions as of  01/19/2013  Medication Sig Dispense Refill  . acetaminophen (TYLENOL) 325 MG tablet Take 325 mg by mouth as needed.        . Calcium Carbonate-Vitamin D (CALCIUM + D) 600-200 MG-UNIT TABS Take 1 tablet by mouth daily.        . Multiple Vitamins-Minerals (CENTRUM SILVER PO) Take 1 tablet by mouth daily.        . pantoprazole (PROTONIX) 40 MG tablet TAKE ONE TABLET BY MOUTH EVERY DAY  30 tablet  5  . dexlansoprazole (DEXILANT) 60 MG capsule Take 1 capsule (60 mg total) by mouth daily.  30 capsule  3   No facility-administered encounter medications on file as of 01/19/2013.     Orders Placed This Encounter  Procedures  . Urinalysis  . [place], retroperitnl abd,  ltd    No Follow-up on file.

## 2013-01-21 ENCOUNTER — Encounter: Payer: Self-pay | Admitting: Internal Medicine

## 2013-01-22 DIAGNOSIS — R3911 Hesitancy of micturition: Secondary | ICD-10-CM | POA: Insufficient documentation

## 2013-01-22 NOTE — Assessment & Plan Note (Signed)
Symptoms are not controlled with protonoix.  Trial of dexilant.  Samples given

## 2013-01-22 NOTE — Assessment & Plan Note (Addendum)
She has no signs of infection on today's UA. Abdominal exam is negative for distension, masses or tenderness.  Will order a bladder ultrasound with a post void residual to rule out neurogenic bladder.

## 2013-06-01 ENCOUNTER — Ambulatory Visit: Payer: Self-pay | Admitting: Internal Medicine

## 2013-06-02 ENCOUNTER — Other Ambulatory Visit: Payer: Self-pay | Admitting: Internal Medicine

## 2013-06-10 ENCOUNTER — Encounter: Payer: Self-pay | Admitting: Internal Medicine

## 2013-06-23 ENCOUNTER — Encounter: Payer: Self-pay | Admitting: Adult Health

## 2013-06-23 ENCOUNTER — Ambulatory Visit (INDEPENDENT_AMBULATORY_CARE_PROVIDER_SITE_OTHER): Payer: Medicare Other | Admitting: Adult Health

## 2013-06-23 VITALS — BP 134/78 | HR 57 | Temp 97.8°F | Resp 14 | Wt 159.0 lb

## 2013-06-23 DIAGNOSIS — R221 Localized swelling, mass and lump, neck: Secondary | ICD-10-CM

## 2013-06-23 DIAGNOSIS — R5381 Other malaise: Secondary | ICD-10-CM

## 2013-06-23 DIAGNOSIS — Z79899 Other long term (current) drug therapy: Secondary | ICD-10-CM

## 2013-06-23 DIAGNOSIS — E785 Hyperlipidemia, unspecified: Secondary | ICD-10-CM

## 2013-06-23 DIAGNOSIS — E042 Nontoxic multinodular goiter: Secondary | ICD-10-CM | POA: Insufficient documentation

## 2013-06-23 DIAGNOSIS — Z0000 Encounter for general adult medical examination without abnormal findings: Secondary | ICD-10-CM

## 2013-06-23 DIAGNOSIS — R5383 Other fatigue: Secondary | ICD-10-CM

## 2013-06-23 DIAGNOSIS — R220 Localized swelling, mass and lump, head: Secondary | ICD-10-CM

## 2013-06-23 LAB — COMPREHENSIVE METABOLIC PANEL
AST: 17 U/L (ref 0–37)
Albumin: 4.2 g/dL (ref 3.5–5.2)
Alkaline Phosphatase: 47 U/L (ref 39–117)
BUN: 16 mg/dL (ref 6–23)
Calcium: 10 mg/dL (ref 8.4–10.5)
Creatinine, Ser: 0.7 mg/dL (ref 0.4–1.2)
Potassium: 3.7 mEq/L (ref 3.5–5.1)
Sodium: 140 mEq/L (ref 135–145)
Total Bilirubin: 0.8 mg/dL (ref 0.3–1.2)
Total Protein: 7.1 g/dL (ref 6.0–8.3)

## 2013-06-23 LAB — CBC WITH DIFFERENTIAL/PLATELET
Basophils Absolute: 0 10*3/uL (ref 0.0–0.1)
Eosinophils Absolute: 0.2 10*3/uL (ref 0.0–0.7)
MCHC: 34 g/dL (ref 30.0–36.0)
MCV: 89.1 fl (ref 78.0–100.0)
Monocytes Absolute: 0.8 10*3/uL (ref 0.1–1.0)
Neutrophils Relative %: 57.7 % (ref 43.0–77.0)
Platelets: 357 10*3/uL (ref 150.0–400.0)
RDW: 13.4 % (ref 11.5–14.6)

## 2013-06-23 LAB — LIPID PANEL
Cholesterol: 176 mg/dL (ref 0–200)
Total CHOL/HDL Ratio: 3
Triglycerides: 63 mg/dL (ref 0.0–149.0)

## 2013-06-23 MED ORDER — PANTOPRAZOLE SODIUM 40 MG PO TBEC: 40.0000 mg | DELAYED_RELEASE_TABLET | Freq: Every day | ORAL | Status: DC

## 2013-06-23 NOTE — Progress Notes (Signed)
Pre visit review using our clinic review tool, if applicable. No additional management support is needed unless otherwise documented below in the visit note. 

## 2013-06-23 NOTE — Assessment & Plan Note (Signed)
Noted soft mass on right side of neck. Send for CT soft tissue neck.

## 2013-06-23 NOTE — Progress Notes (Signed)
Subjective:    Patient ID: Bridget [name], female    DOB: 09-27-1930, 79 y.o.   MRN: [id number]  HPI  The patient is here for annual Medicare wellness examination as well as other chronic and acute problems.   The risk factors are reflected in the social history.  The roster of all physicians providing medical care to patient is listed in the Snapshot section of the chart.  Activities of daily living:  The patient is 100% independent in all ADLs: dressing, toileting, bathing, feeding as well as independent mobility.  Instrumental Activities of daily living: The patient is 100% independent in all iADLs: cooking, driving, keeping track of finances, managing medications, shopping, using telephone and computer.  Home safety: The patient has smoke detectors in the home. Seatbelts are worn 100%.  There are firearms at home. There is no violence in the home. No hx of IPV.  There is no risks for hepatitis, STDs or HIV. There is no history of blood transfusion. No travel history to infectious disease endemic areas of the world.  The patient has seen dentist this year for her cleaning. Pt has seen eye doctor in the last year. No hearing impairment. They have deferred audiologic testing in the last year.  No excessive sun exposure. Discussed the need for sun protection: hats, long sleeves and use of sunscreen if there is significant sun exposure.   Diet: the importance of a healthy diet is discussed. Pt follows a healthy diet.  The benefits of regular aerobic exercise were discussed. Patient walks daily for 30-60 min.  Depression screen: there are no signs or vegative symptoms of depression- irritability, change in appetite, anhedonia, sadness/tearfullness.  Cognitive assessment: the patient manages all their financial and personal affairs and is actively engaged. Able to relate day,date,year and events; recalled 2/3 objects at 3 minutes; performed clock-face test normally.  The following  portions of the patient's history were reviewed and updated as appropriate: allergies, current medications, past family history, past medical history,  past surgical history, past social history  and problem list.  Visual acuity was not assessed per patient preference since has regular follow up with ophthalmologist. Hearing and body mass index were assessed and reviewed.   During the course of the visit the patient was educated and counseled about appropriate screening and preventive services including : fall prevention , diabetes screening, nutrition counseling, colorectal cancer screening, and recommended immunizations.      Past Medical History  Diagnosis Date  . GERD (gastroesophageal reflux disease)   . Diverticulosis of colon without diverticulitis      Past Surgical History  Procedure Laterality Date  . Oophorectomy      Bilateral  . Abdominal hysterectomy      Not due to cancer, due to fibroids, BSO     Family History  Problem Relation Age of Onset  . Cancer Brother     prostate  . Cancer Sister     colon  . Cancer Sister     lung  . Cancer Sister     bone  . Cancer Brother     lung (smoker)  . Heart failure Mother     Massive MI  . Heart failure Mother      History   Social History  . Marital Status: Married    Spouse Name: N/A    Number of Children: N/A  . Years of Education: N/A   Occupational History  . 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 Smoking status: Never Smoker   . Smokeless tobacco: Never Used  . Alcohol Use: No  . Drug Use: No  . Sexual Activity: Not on file   Other Topics Concern  . Not on file   Social History Narrative  . No narrative on file      Review of Systems  Constitutional: Negative.   HENT: Negative.   Eyes: Negative.   Respiratory: Negative.   Cardiovascular: Negative.   Gastrointestinal: Negative.   Endocrine: Negative.   Genitourinary: Negative.     Musculoskeletal: Negative.   Skin: Negative.   Allergic/Immunologic: Negative.   Neurological: Negative.   Hematological: Negative.   Psychiatric/Behavioral: Negative.        Objective:   Physical Exam  Constitutional: She is oriented to person, place, and time. She appears well-developed and well-nourished. No distress.  HENT:  Head: Normocephalic and atraumatic.  Right Ear: External ear normal.  Left Ear: External ear normal.  Nose: Nose normal.  Mouth/Throat: Oropharynx is clear and moist.  Eyes: Conjunctivae and EOM are normal. Pupils are equal, round, and reactive to light.  Neck: Normal range of motion. Neck supple. No tracheal deviation present. Thyromegaly present.    Cardiovascular: Normal rate, regular rhythm, normal heart sounds and intact distal pulses.  Exam reveals no gallop and no friction rub.   No murmur heard. Pulmonary/Chest: Effort normal and breath sounds normal. No respiratory distress. She has no wheezes. She has no rales.  Abdominal: Soft. Bowel sounds are normal. She exhibits no distension and no mass. There is no tenderness. There is no rebound and no guarding.  Musculoskeletal: Normal range of motion. She exhibits no edema and no tenderness.  Lymphadenopathy:    She has no cervical adenopathy.  Neurological: She is alert and oriented to person, place, and time. She has normal reflexes. No cranial nerve deficit. Coordination normal.  Skin: Skin is warm and dry.  Psychiatric: She has a normal mood and affect. Her behavior is normal. Judgment and thought content normal.    BP 134/78  Pulse 57  Temp(Src) 97.8 F (36.6 C) (Oral)  Wt 159 lb (72.122 kg)  SpO2 97%       Assessment & Plan:

## 2013-06-23 NOTE — Assessment & Plan Note (Signed)
Annual comprehensive exam was done excluding breast, pelvic exam. All screenings have been addressed.

## 2013-06-27 ENCOUNTER — Ambulatory Visit: Payer: Self-pay | Admitting: Adult Health

## 2013-06-28 ENCOUNTER — Telehealth: Payer: Self-pay | Admitting: Internal Medicine

## 2013-06-28 DIAGNOSIS — E042 Nontoxic multinodular goiter: Secondary | ICD-10-CM

## 2013-06-28 NOTE — Telephone Encounter (Signed)
Left message for patient to return call.

## 2013-06-28 NOTE — Telephone Encounter (Signed)
CT revealed very large goiter , multinodular .  Thyroid function is normal.  Referral t o endocrine under way

## 2013-06-29 NOTE — Telephone Encounter (Signed)
Patient notified of results and voiced understanding. 

## 2013-07-18 ENCOUNTER — Encounter: Payer: Self-pay | Admitting: Adult Health

## 2013-08-17 ENCOUNTER — Encounter: Payer: Self-pay | Admitting: Internal Medicine

## 2013-08-17 ENCOUNTER — Ambulatory Visit (INDEPENDENT_AMBULATORY_CARE_PROVIDER_SITE_OTHER): Payer: Medicare Other | Admitting: Internal Medicine

## 2013-08-17 VITALS — BP 136/82 | HR 67 | Temp 98.0°F | Resp 12 | Ht 64.75 in | Wt 159.8 lb

## 2013-08-17 DIAGNOSIS — E042 Nontoxic multinodular goiter: Secondary | ICD-10-CM

## 2013-08-17 NOTE — Progress Notes (Addendum)
Patient ID: Bridget [name], female   DOB: [birth year], 80 y.o.   MRN: [id number]   HPI  Bridget [name] is a 80 y.o.-year-old female, referred by her PCP, Dr. Derrel Nip, for evaluation for MNG.  Pt describes a R neck mass for 2 mo >> CT neck (06/27/2013): marked enlargement of thyroid in multinodular pattern, with extension in the sup mediastinum c/w benign goiter L>R. No LAD.    I reviewed pt's thyroid tests: Lab Results  Component Value Date   TSH 0.79 06/23/2013   TSH 0.92 02/25/2012    Pt denies hoarseness, dysphagia/odynophagia, SOB with lying down.  Pt c/o: - no heat intolerance (but has occasional hot flushes)/no cold intolerance - occasional tremors - no palpitations - no anxiety/depression - no hyperdefecation/constipation - + weight gain - over a long period of time - no dry skin - no hair falling - no fatigue  Pt does have a FH of thyroid ds.in brother (thyroid nodule), sister (thyroid nodules). No FH of thyroid cancer. No h/o radiation tx to head or neck.  No seaweed or kelp, no recent contrast studies. Had a steroid taper. No herbal supplements.   I reviewed her chart and she also has a history of GERD, osteopenia, diverticulosis.   ROS: Constitutional: see HPI Eyes: no blurry vision, no xerophthalmia ENT: no sore throat, no nodules palpated in throat, no dysphagia/odynophagia, no hoarseness Cardiovascular: no CP/SOB/palpitations/leg swelling Respiratory: no cough/SOB Gastrointestinal: no N/V/D/C, + acid reflux Musculoskeletal: no muscle/joint aches Skin: no rashes Neurological: no tremors/numbness/tingling/dizziness Psychiatric: no depression/anxiety  Past Medical History  Diagnosis Date  . GERD (gastroesophageal reflux disease)   . Diverticulosis of colon without diverticulitis    Past Surgical History  Procedure Laterality Date  . Oophorectomy      Bilateral  . Abdominal hysterectomy      Not due to cancer, due to fibroids, BSO   History   Social  History  . Marital Status: Married    Spouse Name: N/A    Number of Children: 1   Occupational History  . 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 Smoking status: Never Smoker   . Smokeless tobacco: Never Used  . Alcohol Use: No  . Drug Use: No   Current Outpatient Prescriptions on File Prior to Visit  Medication Sig Dispense Refill  . acetaminophen (TYLENOL) 325 MG tablet Take 325 mg by mouth as needed.        . Calcium Carbonate-Vitamin D (CALCIUM + D) 600-200 MG-UNIT TABS Take 1 tablet by mouth daily.        . Multiple Vitamins-Minerals (CENTRUM SILVER PO) Take 1 tablet by mouth daily.        . pantoprazole (PROTONIX) 40 MG tablet Take 1 tablet (40 mg total) by mouth daily.  30 tablet  5   No current facility-administered medications on file prior to visit.   Allergies  Allergen Reactions  . Sulfa Antibiotics Itching   Family History  Problem Relation Age of Onset  . Cancer Brother     prostate  . Cancer Sister     colon  . Cancer Sister     lung  . Cancer Sister     bone  . Cancer Brother     lung (smoker)  . Heart failure Mother     Massive MI  . Heart failure Mother    PE: BP 136/82  Pulse 67  Temp(Src) 98 F (  36.7 C) (Oral)  Resp 12  Ht 5' 4.75" (1.645 m)  Wt 159 lb 12.8 oz (72.485 kg)  BMI 26.79 kg/m2  SpO2 96% Wt Readings from Last 3 Encounters:  08/17/13 159 lb 12.8 oz (72.485 kg)  06/23/13 159 lb (72.122 kg)  01/19/13 156 lb 4 oz (70.875 kg)   Constitutional: overweight, in NAD Eyes: PERRLA, EOMI, no exophthalmos ENT: moist mucous membranes, + thyromegaly L>R, no cervical lymphadenopathy Cardiovascular: RRR, No MRG Respiratory: CTA B Gastrointestinal: abdomen soft, NT, ND, BS+ Musculoskeletal: no deformities, strength intact in all 4;  Skin: moist, warm, no rashes Neurological: no tremor with outstretched hands, DTR normal in all 4  ASSESSMENT: 1. MNG - by CT neck - no neck compression  sxs  PLAN: 1. MNG  - I reviewed the report of her neck CT along with the patient. I pointed out that she has several thyroid nodules , but these are not well characterized on a CT scan, we will need a thyroid U/S. Pt does not have a thyroid cancer family history or a personal history of RxTx to head/neck. All these would favor benignity. - I explained that, if she has large nodules on U/S, the only way that we can tell exactly if it is cancer or not is by doing a thyroid biopsy (FNA). - I explained that this is not cancer, we can continue to follow her on a yearly basis, and check another ultrasound in another year or 2. - she should let me know if she develops neck compression symptoms, in that case, we might need to do either lobectomy or thyroidectomy - I'll see her back in a year  Orders Placed This Encounter  Procedures  . US Soft Tissue Head/Neck   Received report from [facility] from 08/22/2013:  Right thyroid lobe: 5.6 x 2.1 x 2.3 cm, heterogeneously enlarged. There is a 4.1 x 2.2 x 2.6 cm dominant solid nodule in the midportion of the right gland. 5 mm solid nodule upper pole right gland.  Left thyroid lobe: 6.7 x 2.6 x 3.5 cm, heterogeneously enlarged. Dominant 3 x 1.9 x 2.2 cm heterogeneous solid nodule midportion left gland. 1.2 x 0.5 x 0.9 cm hypoechoic nodule left upper gland.  Isthmus: 0.3 cm. No nodules.  Lymphadenopathy: Nonvisualized.   Will d/w pt to obtain Bx of the 3 nodules. I called her and d/w about the option to Bx now or wait another year. She is undecided >> will d/w family and call me back. If she decides for the Bx, would like to have it done in [place].  09/20/2013 Pt still undecided. I will addend the results when they become available.  10/13/2013 FNA: I received the report of the R thyroid nodule Bx: benign follicular cells, Hemosiderin-laden mcf, colloid and blood. Pattern c/w colloid nodule. Same result for the L thyroid Bx:   benign follicular cells, Hemosiderin-laden mcf, colloid and blood. Pattern c/w colloid nodule.

## 2013-08-17 NOTE — Patient Instructions (Signed)
Please return in 1 year. Please stop to talk to [facility] about scheduling a thyroid ultrasound. We will call you with the results and further plan.

## 2013-08-22 ENCOUNTER — Ambulatory Visit: Payer: Self-pay | Admitting: Internal Medicine

## 2013-09-21 ENCOUNTER — Encounter: Payer: Self-pay | Admitting: Internal Medicine

## 2013-09-26 ENCOUNTER — Telehealth: Payer: Self-pay | Admitting: Internal Medicine

## 2013-09-26 NOTE — Telephone Encounter (Signed)
Pt would like to speak with nurse regarding thyroid biopsy   Please advise   Call [phone]   Thank You:)

## 2013-09-27 ENCOUNTER — Other Ambulatory Visit: Payer: Self-pay | Admitting: Internal Medicine

## 2013-09-27 DIAGNOSIS — E042 Nontoxic multinodular goiter: Secondary | ICD-10-CM

## 2013-09-27 NOTE — Telephone Encounter (Signed)
Bridget [name], did she decide for the Bx's? If so, I will order.

## 2013-09-27 NOTE — Telephone Encounter (Signed)
FNA ordered.

## 2013-09-27 NOTE — Telephone Encounter (Signed)
Pt called regarding her thyroid biopsy. Please advise.

## 2013-09-27 NOTE — Telephone Encounter (Signed)
Called Bridget [name] and she said she is decided to have the thyroid bx. Dr Cruzita Lederer can oder this. Advised Bridget [name] they will call her with an appt. Bridget [name] understood.

## 2013-09-28 ENCOUNTER — Other Ambulatory Visit: Payer: Self-pay | Admitting: Internal Medicine

## 2013-09-28 DIAGNOSIS — E042 Nontoxic multinodular goiter: Secondary | ICD-10-CM

## 2013-10-10 ENCOUNTER — Ambulatory Visit: Payer: Self-pay | Admitting: Internal Medicine

## 2013-10-13 ENCOUNTER — Telehealth: Payer: Self-pay | Admitting: *Deleted

## 2013-10-13 NOTE — Telephone Encounter (Signed)
Called pt and advised her that her Both thyroid biopsies are benign! Great news! Pt pleased!!

## 2013-11-03 ENCOUNTER — Encounter: Payer: Self-pay | Admitting: Internal Medicine

## 2014-01-09 ENCOUNTER — Ambulatory Visit (INDEPENDENT_AMBULATORY_CARE_PROVIDER_SITE_OTHER): Payer: Medicare Other | Admitting: Internal Medicine

## 2014-01-09 ENCOUNTER — Telehealth: Payer: Self-pay | Admitting: Internal Medicine

## 2014-01-09 ENCOUNTER — Encounter: Payer: Self-pay | Admitting: Internal Medicine

## 2014-01-09 VITALS — BP 142/80 | HR 65 | Temp 98.0°F | Resp 18 | Ht 64.75 in | Wt 159.8 lb

## 2014-01-09 DIAGNOSIS — M25561 Pain in right knee: Secondary | ICD-10-CM

## 2014-01-09 DIAGNOSIS — M25569 Pain in unspecified knee: Secondary | ICD-10-CM

## 2014-01-09 MED ORDER — HYDROCODONE-ACETAMINOPHEN 5-325 MG PO TABS: 1.0000 | ORAL_TABLET | Freq: Four times a day (QID) | ORAL | Status: DC | PRN

## 2014-01-09 MED ORDER — PREDNISONE (PAK) 10 MG PO TABS: ORAL_TABLET | ORAL | Status: DC

## 2014-01-09 NOTE — Telephone Encounter (Signed)
Patient scheduled at 5.15 

## 2014-01-09 NOTE — Telephone Encounter (Signed)
I cannot work patient in today  We need to keep my Monday evening appts open for these type of urgents, not fill them with follow ups

## 2014-01-09 NOTE — Progress Notes (Signed)
Pre-visit discussion using our clinic review tool. No additional management support is needed unless otherwise documented below in the visit note.  

## 2014-01-09 NOTE — Telephone Encounter (Signed)
FYI

## 2014-01-09 NOTE — Progress Notes (Signed)
Patient ID: Bridget [name], female   DOB: 1930-08-28, 80 y.o.   MRN: [id number]   Patient Active Problem List   Diagnosis Date Noted  . Knee pain, acute 01/10/2014  . Multinodular goiter 06/23/2013  . Urinary hesitancy 01/22/2013  . Diverticulosis of colon without diverticulitis   . Menopausal sweats 02/25/2012  . Routine general medical examination at a health care facility 02/25/2012  . Osteopenia 08/01/2011  . GERD (gastroesophageal reflux disease)     Subjective:  CC:   Chief Complaint  Patient presents with  . Knee Pain    arthritis , severe pain X 2 days right knee    HPI:   Bridget [name] is a 80 y.o. female who presents for   Past Medical History  Diagnosis Date  . GERD (gastroesophageal reflux disease)   . Diverticulosis of colon without diverticulitis     Past Surgical History  Procedure Laterality Date  . Oophorectomy      Bilateral  . Abdominal hysterectomy      Not due to cancer, due to fibroids, BSO       The following portions of the patient's history were reviewed and updated as appropriate: Allergies, current medications, and problem list.    Review of Systems:   Patient denies headache, fevers, malaise, unintentional weight loss, skin rash, eye pain, sinus congestion and sinus pain, sore throat, dysphagia,  hemoptysis , cough, dyspnea, wheezing, chest pain, palpitations, orthopnea, edema, abdominal pain, nausea, melena, diarrhea, constipation, flank pain, dysuria, hematuria, urinary  Frequency, nocturia, numbness, tingling, seizures,  Focal weakness, Loss of consciousness,  Tremor, insomnia, depression, anxiety, and suicidal ideation.     History   Social History  . Marital Status: Married    Spouse Name: N/A    Number of Children: N/A  . Years of Education: N/A   Occupational History  . 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 Smoking status: Never Smoker   . Smokeless tobacco: Never  Used  . Alcohol Use: No  . Drug Use: No  . Sexual Activity: Not on file   Other Topics Concern  . Not on file   Social History Narrative  . No narrative on file    Objective:  Filed Vitals:   01/09/14 1724  BP: 142/80  Pulse: 65  Temp: 98 F (36.7 C)  Resp: 18     General appearance: alert, cooperative and appears stated age Back: symmetric, no curvature. ROM normal. No CVA tenderness. Lungs: clear to auscultation bilaterally Heart: regular rate and rhythm, S1, S2 normal, no murmur, click, rub or gallop Abdomen: soft, non-tender; bowel sounds normal; no masses,  no organomegaly Pulses: 2+ and symmetric Skin: Skin color, texture, turgor normal. No rashes or lesions Lymph nodes: Cervical, supraclavicular, and axillary nodes normal. MSK: right knee tender over medial tibial plateau. Severe pain with full extension   Assessment and Plan:  Knee pain, acute Bursitis vs meniscal tear, Prednisone taper, vicodin,,  Orthopedics referral    Updated Medication List Outpatient Encounter Prescriptions as of 01/09/2014  Medication Sig  . acetaminophen (TYLENOL) 325 MG tablet Take 325 mg by mouth as needed.    . Calcium Carbonate-Vitamin D (CALCIUM + D) 600-200 MG-UNIT TABS Take 1 tablet by mouth daily.    . Multiple Vitamins-Minerals (CENTRUM SILVER PO) Take 1 tablet by mouth daily.    . pantoprazole (PROTONIX) 40 MG tablet Take 1 tablet (40 mg total) by mouth  daily.  Marland Kitchen HYDROcodone-acetaminophen (NORCO/VICODIN) 5-325 MG per tablet Take 1 tablet by mouth every 6 (six) hours as needed for moderate pain.  . predniSONE (STERAPRED UNI-PAK) 10 MG tablet 6 tablets on Day 1 , then reduce by 1 tablet daily until gone     Orders Placed This Encounter  Procedures  . Ambulatory referral to Orthopedic Surgery    No Follow-up on file.

## 2014-01-09 NOTE — Patient Instructions (Signed)
You either have bursitis of the knee or a tendon/ligament that has torn  Prednisone taper starting tomorrow (since you took naproxen today)  And hydrocodone every 6 hours as needed  Take dulcolax or ex lax if you get constipated  Referral to Dr.  Sabra Heck  At [facility]

## 2014-01-09 NOTE — Telephone Encounter (Signed)
Patient Information:  Caller Name: Tracey  Phone: [phone]  Patient: Adesuwa, Osgood  Gender: Female  DOB: 11/30/1927  Age: 80 Years  PCP: Deborra Medina (Adults only)  Office Follow Up:  Does the office need to follow up with this patient?: Yes  Instructions For The Office: Office please call pt to see if you can work her in for an appt.  If not, she will go to UC today.   Symptoms  Reason For Call & Symptoms: Pt is calling and states that she is having severe pain to to right knee; no injury noted; no swelling; no redness; rates pain 10/10; cn not put any weight on the leg;  Reviewed Health History In EMR: Yes  Reviewed Medications In EMR: Yes  Reviewed Allergies In EMR: Yes  Reviewed Surgeries / Procedures: Yes  Date of Onset of Symptoms: 01/08/2014  Treatments Tried: Tylenol 1000mg  q4hr prn  Treatments Tried Worked: No  Guideline(s) Used:  Knee Pain  Disposition Per Guideline:   See Today in Office  Reason For Disposition Reached:   Severe pain (e.g., excruciating, unable to walk)  Advice Given:  Pain Medicines:  For pain relief, you can take either acetaminophen, ibuprofen, or naproxen.  Rest Your Knee   for the next couple days. Avoid activities that worsen your pain. Reduce activities that put a lot of strain on the knee joint (e.g., deep knee bends, stair climbing, running).  Knee Pain after Overuse:   Apply Cold to the Area: Apply a cold pack or ice bag (wrapped in a moist towel) to the area for 20 minutes. Repeat in 1 hour, then every 4 hours while awake. Continue this for the first 48 hours after an overuse injury (Reason: reduce the swelling and pain).  Apply Heat to the Area: Beginning 48 hours after an injury, apply a warm washcloth or heating pad for 10 minutes three times a day to help increase blood flow and improve healing.  Call Back If:  You become worse.  Patient Will Follow Care Advice:  YES

## 2014-01-10 ENCOUNTER — Encounter: Payer: Self-pay | Admitting: Internal Medicine

## 2014-01-10 DIAGNOSIS — M25569 Pain in unspecified knee: Secondary | ICD-10-CM | POA: Insufficient documentation

## 2014-01-10 NOTE — Assessment & Plan Note (Signed)
Bursitis vs meniscal tear, Prednisone taper, vicodin,,  Orthopedics referral

## 2014-05-26 ENCOUNTER — Other Ambulatory Visit: Payer: Self-pay | Admitting: *Deleted

## 2014-05-26 MED ORDER — PANTOPRAZOLE SODIUM 40 MG PO TBEC: 40.0000 mg | DELAYED_RELEASE_TABLET | Freq: Every day | ORAL | Status: DC

## 2014-11-20 ENCOUNTER — Other Ambulatory Visit: Payer: Self-pay | Admitting: Internal Medicine

## 2014-11-20 DIAGNOSIS — E559 Vitamin D deficiency, unspecified: Secondary | ICD-10-CM

## 2014-11-20 DIAGNOSIS — R5383 Other fatigue: Secondary | ICD-10-CM

## 2014-11-20 DIAGNOSIS — E785 Hyperlipidemia, unspecified: Secondary | ICD-10-CM

## 2014-11-20 MED ORDER — PANTOPRAZOLE SODIUM 40 MG PO TBEC: 40.0000 mg | DELAYED_RELEASE_TABLET | Freq: Every day | ORAL | Status: DC

## 2014-11-20 NOTE — Telephone Encounter (Signed)
60  day refill only,  Needs CMET and fasting lipids prior to  Next visit sPlease  make appt for fasting labs prior to visit

## 2014-11-20 NOTE — Telephone Encounter (Signed)
Last OV 6.15.15, please advise refill

## 2015-01-05 ENCOUNTER — Telehealth: Payer: Self-pay | Admitting: Internal Medicine

## 2015-01-05 NOTE — Telephone Encounter (Signed)
Patient scheduled for 01/18/15 first available.

## 2015-01-05 NOTE — Telephone Encounter (Signed)
[facility] Patient Name: Bridget [name] DOB: 1931-06-16 Initial Comment Caller states having dizzy spells, wants to see dr Nurse Assessment Nurse: Markus Daft, RN, Sherre Poot Date/Time (Eastern Time): 01/05/2015 10:25:11 AM Confirm and document reason for call. If symptomatic, describe symptoms. ---Caller states having dizzy spells - vertigo - off/on for last 6+ months, and wants to see doctor. Yesterday, it was the worst at lasting 10 minutes and she had to hold onto something, and then sat down with the helps of her husband. She's had middle abdominal aches off/on, not severe. Has had this for about 6-7 months. Denies CP, SOB. No fever. Has the patient traveled out of the country within the last 30 days? ---No Does the patient require triage? ---Yes Related visit to physician within the last 2 weeks? ---No Does the PT have any chronic conditions? (i.e. diabetes, asthma, etc.) ---Yes List chronic conditions. ---Acid Reflux Guidelines Guideline Title Affirmed Question Affirmed Notes Dizziness - Vertigo [1] MODERATE dizziness (e.g., feels very unsteady, interferes with normal activities) AND [2] has NOT been evaluated by physician for this -- Pt is not dizzy today. Final Disposition User See Physician within [place], [place], Sherre Poot Comments No available appts today. RN asked caller if willing to go to [facility]. She states that she does not want to. - RN will send a note to the doctor, and see if pt can be worked in to be seen, or what MD would recommend. Caller aware if she worsens before the office calls back to go to UC or ER or call back immediately. Caller verb .understanding.

## 2015-01-05 NOTE — Telephone Encounter (Signed)
I cannot overbook my schedule for her today,  This has been going on for 6 months!  Give her a 15 mintue appt next week

## 2015-01-18 ENCOUNTER — Encounter: Payer: Self-pay | Admitting: Internal Medicine

## 2015-01-18 ENCOUNTER — Ambulatory Visit
Admission: RE | Admit: 2015-01-18 | Discharge: 2015-01-18 | Disposition: A | Discharge status: Home / Self Care | Payer: PPO | Source: Ambulatory Visit | Attending: Internal Medicine | Admitting: Internal Medicine

## 2015-01-18 ENCOUNTER — Ambulatory Visit (INDEPENDENT_AMBULATORY_CARE_PROVIDER_SITE_OTHER): Payer: PPO | Admitting: Internal Medicine

## 2015-01-18 VITALS — BP 104/74 | HR 70 | Temp 98.3°F | Resp 12 | Ht 65.0 in | Wt 156.2 lb

## 2015-01-18 DIAGNOSIS — K5900 Constipation, unspecified: Secondary | ICD-10-CM | POA: Insufficient documentation

## 2015-01-18 DIAGNOSIS — N951 Menopausal and female climacteric states: Secondary | ICD-10-CM

## 2015-01-18 DIAGNOSIS — E559 Vitamin D deficiency, unspecified: Secondary | ICD-10-CM

## 2015-01-18 DIAGNOSIS — R61 Generalized hyperhidrosis: Secondary | ICD-10-CM | POA: Insufficient documentation

## 2015-01-18 DIAGNOSIS — R5383 Other fatigue: Secondary | ICD-10-CM | POA: Diagnosis not present

## 2015-01-18 DIAGNOSIS — K5909 Other constipation: Secondary | ICD-10-CM

## 2015-01-18 DIAGNOSIS — I951 Orthostatic hypotension: Secondary | ICD-10-CM

## 2015-01-18 DIAGNOSIS — R1030 Lower abdominal pain, unspecified: Secondary | ICD-10-CM

## 2015-01-18 DIAGNOSIS — R42 Dizziness and giddiness: Secondary | ICD-10-CM

## 2015-01-18 DIAGNOSIS — E785 Hyperlipidemia, unspecified: Secondary | ICD-10-CM | POA: Diagnosis not present

## 2015-01-18 DIAGNOSIS — R3911 Hesitancy of micturition: Secondary | ICD-10-CM

## 2015-01-18 DIAGNOSIS — R339 Retention of urine, unspecified: Secondary | ICD-10-CM

## 2015-01-18 LAB — CBC WITH DIFFERENTIAL/PLATELET
BASOS PCT: 0.6 % (ref 0.0–3.0)
Basophils Absolute: 0 10*3/uL (ref 0.0–0.1)
EOS ABS: 0.1 10*3/uL (ref 0.0–0.7)
EOS PCT: 2.3 % (ref 0.0–5.0)
HEMATOCRIT: 39.9 % (ref 36.0–46.0)
HEMOGLOBIN: 13.6 g/dL (ref 12.0–15.0)
Lymphocytes Relative: 29.6 % (ref 12.0–46.0)
Lymphs Abs: 1.9 10*3/uL (ref 0.7–4.0)
MCHC: 34 g/dL (ref 30.0–36.0)
MCV: 89.9 fl (ref 78.0–100.0)
Monocytes Absolute: 0.4 10*3/uL (ref 0.1–1.0)
Monocytes Relative: 6.8 % (ref 3.0–12.0)
NEUTROS ABS: 3.9 10*3/uL (ref 1.4–7.7)
NEUTROS PCT: 60.7 % (ref 43.0–77.0)
Platelets: 365 10*3/uL (ref 150.0–400.0)
RBC: 4.44 Mil/uL (ref 3.87–5.11)
RDW: 13.6 % (ref 11.5–15.5)
WBC: 6.5 10*3/uL (ref 4.0–10.5)

## 2015-01-18 LAB — VITAMIN B12: VITAMIN B 12: 1089 pg/mL — AB (ref 211–911)

## 2015-01-18 LAB — POCT URINALYSIS DIPSTICK
Bilirubin, UA: NEGATIVE
Glucose, UA: NEGATIVE
Leukocytes, UA: NEGATIVE
Nitrite, UA: NEGATIVE
PH UA: 5.5
RBC UA: NEGATIVE
Spec Grav, UA: >=1.03
Urobilinogen, UA: 0.2

## 2015-01-18 LAB — URINALYSIS, ROUTINE W REFLEX MICROSCOPIC
Bilirubin Urine: NEGATIVE
HGB URINE DIPSTICK: NEGATIVE
LEUKOCYTES UA: NEGATIVE
Nitrite: NEGATIVE
RBC / HPF: NONE SEEN (ref 0–?)
TOTAL PROTEIN, URINE-UPE24: NEGATIVE
Urine Glucose: NEGATIVE
Urobilinogen, UA: 0.2 (ref 0.0–1.0)
pH: 5.5 (ref 5.0–8.0)

## 2015-01-18 LAB — TSH: TSH: 0.91 u[IU]/mL (ref 0.35–4.50)

## 2015-01-18 LAB — COMPREHENSIVE METABOLIC PANEL
ALBUMIN: 4.5 g/dL (ref 3.5–5.2)
ALT: 14 U/L (ref 0–35)
AST: 20 U/L (ref 0–37)
Alkaline Phosphatase: 59 U/L (ref 39–117)
BUN: 11 mg/dL (ref 6–23)
CALCIUM: 10.3 mg/dL (ref 8.4–10.5)
CO2: 29 meq/L (ref 19–32)
Chloride: 105 mEq/L (ref 96–112)
Creatinine, Ser: 0.8 mg/dL (ref 0.40–1.20)
GFR: 88.29 mL/min (ref >60.00)
GLUCOSE: 96 mg/dL (ref 70–99)
POTASSIUM: 4.2 meq/L (ref 3.5–5.1)
SODIUM: 141 meq/L (ref 135–145)
Total Bilirubin: 0.7 mg/dL (ref 0.2–1.2)
Total Protein: 7 g/dL (ref 6.0–8.3)

## 2015-01-18 LAB — LIPID PANEL
CHOL/HDL RATIO: 4
Cholesterol: 171 mg/dL (ref 0–200)
HDL: 48.2 mg/dL (ref >39.00)
LDL Cholesterol: 103 mg/dL — ABNORMAL HIGH (ref 0–99)
NonHDL: 122.8
Triglycerides: 97 mg/dL (ref 0.0–149.0)
VLDL: 19.4 mg/dL (ref 0.0–40.0)

## 2015-01-18 LAB — CORTISOL: CORTISOL PLASMA: 12.2 ug/dL

## 2015-01-18 LAB — VITAMIN D 25 HYDROXY (VIT D DEFICIENCY, FRACTURES): VITD: 25.67 ng/mL — ABNORMAL LOW (ref 30.00–100.00)

## 2015-01-18 NOTE — Progress Notes (Signed)
Pre-visit discussion using our clinic review tool. No additional management support is needed unless otherwise documented below in the visit note.  

## 2015-01-18 NOTE — Patient Instructions (Signed)
Please increase your water and salt intake and avoid going outside during the heat  Please go to [facility] and get your x rays done   Please start using metamucil every day (or miralax)

## 2015-01-18 NOTE — Progress Notes (Signed)
Subjective:  Patient ID: Bridget [name], female    DOB: 1930/12/16  Age: 81 y.o. MRN: [id number]  CC: The primary encounter diagnosis was Dizziness and giddiness. Diagnoses of Urinary hesitancy, Orthostasis, Vitamin D deficiency, Lower abdominal pain, Unexplained night sweats, Other fatigue, Hyperlipidemia, Incomplete emptying of bladder, Other constipation, and Menopausal sweats were also pertinent to this visit.  HPI Bralyn HENA EWALT presents with multiple subacute and chronic symptoms.   1) for recurrent episodes of dizziness for the past  6 to 7 months.  Orthostatic by today's measurements. actually has vertigo 2 to 3 times per week, occurs while already standing up but also brought on with movement.  No recent falls .  Has been having episodes of sweating she has attributed to hot flashes several times daily , have been occurring since menopause  2)  She has been having urinary urgency but voiding only small amounts at night for the past 2 years (2014 was last visit ).  32 Has had chronic abdominal pain present in her mid section nearly daily .  Has had episodes of emesis  that have occurred while trying to defecate, then afterward has a loose stool.  . Occurring  once every 3 or 4 months  Has also had episodes of watery rectal leakage that occurs uncontrolled 3-4 times per week.  The episodes occur after having a normal caliber /consistency stool.    Drinks 30 ounces of water daily minimum,  Sometimes 60 ounces daily.     Had surgery on back and wonders if it is related to back surgery 5 years ago  No OTC meds.   Only taking protonix and MVI/calcium      Outpatient Prescriptions Prior to Visit  Medication Sig Dispense Refill  . acetaminophen (TYLENOL) 325 MG tablet Take 325 mg by mouth as needed.      . Calcium Carbonate-Vitamin D (CALCIUM + D) 600-200 MG-UNIT TABS Take 1 tablet by mouth daily.      . Multiple Vitamins-Minerals (CENTRUM SILVER PO) Take 1 tablet by mouth daily.        . pantoprazole (PROTONIX) 40 MG tablet TAKE ONE TABLET BY MOUTH ONCE DAILY 30 tablet 0  . HYDROcodone-acetaminophen (NORCO/VICODIN) 5-325 MG per tablet Take 1 tablet by mouth every 6 (six) hours as needed for moderate pain. (Patient not taking: Reported on 01/18/2015) 60 tablet 0  . pantoprazole (PROTONIX) 40 MG tablet Take 1 tablet (40 mg total) by mouth daily. 30 tablet 1  . predniSONE (STERAPRED UNI-PAK) 10 MG tablet 6 tablets on Day 1 , then reduce by 1 tablet daily until gone (Patient not taking: Reported on 01/18/2015) 21 tablet 0   No facility-administered medications prior to visit.    Review of Systems;  Patient denies headache, fevers, malaise, unintentional weight loss, skin rash, eye pain, sinus congestion and sinus pain, sore throat, dysphagia,  hemoptysis , cough, dyspnea, wheezing, chest pain, palpitations, orthopnea, edema, abdominal pain, nausea, melena, diarrhea, constipation, flank pain, dysuria, hematuria, urinary  Frequency, nocturia, numbness, tingling, seizures,  Focal weakness, Loss of consciousness,  Tremor, insomnia, depression, anxiety, and suicidal ideation.      Objective:  BP 104/74 mmHg  Pulse 70  Temp(Src) 98.3 F (36.8 C) (Oral)  Resp 12  Ht '5\' 5"'  (1.651 m)  Wt 156 lb 4 oz (70.875 kg)  BMI 26.00 kg/m2  SpO2 98%  BP Readings from Last 3 Encounters:  01/18/15 104/74  01/09/14 142/80  08/17/13 136/82    Wt Readings from  Last 3 Encounters:  01/18/15 156 lb 4 oz (70.875 kg)  01/09/14 159 lb 13 oz (72.49 kg)  08/17/13 159 lb 12.8 oz (72.485 kg)    General appearance: alert, cooperative and appears stated age Ears: normal TM's and external ear canals both ears Throat: lips, mucosa, and tongue normal; teeth and gums normal Neck: no adenopathy, no carotid bruit, supple, symmetrical, trachea midline and thyroid not enlarged, symmetric, no tenderness/mass/nodules Back: symmetric, no curvature. ROM normal. No CVA tenderness. Lungs: clear to auscultation  bilaterally Heart: regular rate and rhythm, S1, S2 normal, no murmur, click, rub or gallop Abdomen: soft, non-tender; bowel sounds normal; no masses,  no organomegaly Pulses: 2+ and symmetric Skin: Skin color, texture, turgor normal. No rashes or lesions Lymph nodes: Cervical, supraclavicular, and axillary nodes normal.  No results found for: HGBA1C  Lab Results  Component Value Date   CREATININE 0.80 01/18/2015   CREATININE 0.7 06/23/2013   CREATININE 0.8 02/25/2012    Lab Results  Component Value Date   WBC 6.5 01/18/2015   HGB 13.6 01/18/2015   HCT 39.9 01/18/2015   PLT 365.0 01/18/2015   GLUCOSE 96 01/18/2015   CHOL 171 01/18/2015   TRIG 97.0 01/18/2015   HDL 48.20 01/18/2015   LDLCALC 103* 01/18/2015   ALT 14 01/18/2015   AST 20 01/18/2015   NA 141 01/18/2015   K 4.2 01/18/2015   CL 105 01/18/2015   CREATININE 0.80 01/18/2015   BUN 11 01/18/2015   CO2 29 01/18/2015   TSH 0.91 01/18/2015    No results found.  Assessment & Plan:   Problem List Items Addressed This Visit      Unprioritized   Menopausal sweats    Cbc and CXR are normal and she has no lymphadenopathy on exam. Reassurance provided.       Constipation    Suggested by history and plain films,  Advised to start using a bulk forming laxative.       Dizziness and giddiness - Primary    With orthostasis on exam. B12, thyroid and CBC are normal.  Urine is conentrated.  Suggested she may be dehydrated,  Advised to increase her water intake.     Lab Results  Component Value Date   WBC 6.5 01/18/2015   HGB 13.6 01/18/2015   HCT 39.9 01/18/2015   MCV 89.9 01/18/2015   PLT 365.0 01/18/2015   Lab Results  Component Value Date   CREATININE 0.80 01/18/2015         Relevant Orders   CBC with Differential/Platelet (Completed)   Comp Met (CMET) (Completed)   T4 AND TSH   B12 (Completed)   Urinary hesitancy   Relevant Orders   POCT Urinalysis Dipstick (Completed)   Urine Culture    Urinalysis, Routine w reflex microscopic (Completed)   Bladder Scan (PSC)    Other Visit Diagnoses    Orthostasis        Relevant Orders    Cortisol (Completed)    CBC with Differential/Platelet (Completed)    Comp Met (CMET) (Completed)    T4 AND TSH    Vitamin D deficiency        Relevant Orders    Vit D  25 hydroxy (rtn osteoporosis monitoring) (Completed)    Lower abdominal pain        Relevant Orders    DG Abd 2 Views (Completed)    Unexplained night sweats        Relevant Orders    DG Chest 2  View (Completed)    Other fatigue        Hyperlipidemia        Incomplete emptying of bladder        Relevant Orders    Bladder Scan (PSC)       I am having Ms. Varas maintain her Multiple Vitamins-Minerals (CENTRUM SILVER PO), Calcium Carbonate-Vitamin D, acetaminophen, predniSONE, HYDROcodone-acetaminophen, pantoprazole, and pantoprazole.  No orders of the defined types were placed in this encounter.    A total of 40 minutes was spent with patient more than half of which was spent in counseling patient on the above mentioned issues , reviewing and explaining recent labs and imaging studies done, and coordination of care. There are no discontinued medications.  Follow-up: Return in about 1 week (around 01/25/2015).   Crecencio Mc, MD

## 2015-01-20 DIAGNOSIS — R42 Dizziness and giddiness: Secondary | ICD-10-CM | POA: Insufficient documentation

## 2015-01-20 DIAGNOSIS — K5900 Constipation, unspecified: Secondary | ICD-10-CM | POA: Insufficient documentation

## 2015-01-20 NOTE — Assessment & Plan Note (Signed)
Cbc and CXR are normal and she has no lymphadenopathy on exam. Reassurance provided.

## 2015-01-20 NOTE — Assessment & Plan Note (Signed)
With orthostasis on exam. B12, thyroid and CBC are normal.  Urine is conentrated.  Suggested she may be dehydrated,  Advised to increase her water intake.     Lab Results  Component Value Date   WBC 6.5 01/18/2015   HGB 13.6 01/18/2015   HCT 39.9 01/18/2015   MCV 89.9 01/18/2015   PLT 365.0 01/18/2015   Lab Results  Component Value Date   CREATININE 0.80 01/18/2015

## 2015-01-20 NOTE — Assessment & Plan Note (Signed)
Suggested by history and plain films,  Advised to start using a bulk forming laxative.

## 2015-01-21 LAB — URINE CULTURE
Colony Count: NO GROWTH
Organism ID, Bacteria: NO GROWTH

## 2015-01-24 LAB — SPECIMEN STATUS REPORT

## 2015-01-24 LAB — T4 AND TSH
T4, Total: 6.8 ug/dL (ref 4.5–12.0)
TSH: 1.14 u[IU]/mL (ref 0.450–4.500)

## 2015-01-30 ENCOUNTER — Encounter: Payer: Self-pay | Admitting: Internal Medicine

## 2015-01-30 ENCOUNTER — Ambulatory Visit (INDEPENDENT_AMBULATORY_CARE_PROVIDER_SITE_OTHER): Payer: PPO | Admitting: Internal Medicine

## 2015-01-30 VITALS — BP 120/62 | HR 72 | Temp 97.9°F | Resp 12 | Ht 65.0 in | Wt 157.8 lb

## 2015-01-30 DIAGNOSIS — R42 Dizziness and giddiness: Secondary | ICD-10-CM

## 2015-01-30 DIAGNOSIS — R3911 Hesitancy of micturition: Secondary | ICD-10-CM

## 2015-01-30 DIAGNOSIS — R5383 Other fatigue: Secondary | ICD-10-CM

## 2015-01-30 DIAGNOSIS — K5900 Constipation, unspecified: Secondary | ICD-10-CM | POA: Diagnosis not present

## 2015-01-30 NOTE — Assessment & Plan Note (Signed)
With overactivity and urgency,  Followed by inability to void when full,  Bladder scan with post void residual advise.d

## 2015-01-30 NOTE — Patient Instructions (Addendum)
Your vitamin D is borderline low.  If you are not taking a D3 supplement,  please start taking 1000  To 2000 IUS daily.  If you are ,  Low Vitamin D can increase your risk of weak bones and fractures and interfere with your body's ability to absorb the calcium in your diet.   I am sending you next week  for an ultrasound of your bladder to make sure it is emptying properly.  If it is normal  We will try a medication to help it be less overactive.  Overactive Bladder The bladder has two functions that are totally opposite of the other. One is to relax and stretch out so it can store urine (fills like a balloon), and the other is to contract and squeeze down so that it can empty the urine that it has stored. Proper functioning of the bladder is a complex mixing of these two functions. The filling and emptying of the bladder can be influenced by:  The bladder.  The spinal cord.  The brain.  The nerves going to the bladder.  Other organs that are closely related to the bladder such as prostate in males and the vagina in females. As your bladder fills with urine, nerve signals are sent from the bladder to the brain to tell you that you may need to urinate. Normal urination requires that the bladder squeeze down with sufficient strength to empty the bladder, but this also requires that the bladder squeeze down sufficiently long to finish the job. In addition the sphincter muscles, which normally keep you from leaking urine, must also relax so that the urine can pass. Coordination between the bladder muscle squeezing down and the sphincter muscles relaxing is required to make everything happen normally. With an overactive bladder sometimes the muscles of the bladder contract unexpectedly and involuntarily and this causes an urgent need to urinate. The normal response is to try to hold urine in by contracting the sphincter muscles. Sometimes the bladder contracts so strongly that the sphincter muscles  cannot stop the urine from passing out and incontinence occurs. This kind of incontinence is called urge incontinence. Having an overactive bladder can be embarrassing and awkward. It can keep you from living life the way you want to. Many people think it is just something you have to put up with as you grow older or have certain health conditions. In fact, there are treatments that can help make your life easier and more pleasant. CAUSES  Many things can cause an overactive bladder. Possibilities include:  Urinary tract infection or infection of nearby tissues such as the prostate.  Prostate enlargement.  In women, multiple pregnancies or surgery on the uterus or urethra.  Bladder stones, inflammation, or tumors.  Caffeine.  Alcohol.  Medications. For example, diuretics (drugs that help the body get rid of extra fluid) increase urine production. Some other medicines must be taken with lots of fluids.  Muscle or nerve weakness. This might be the result of a spinal cord injury, a stroke, multiple sclerosis, or Parkinson disease.  Diabetes can cause a high urine volume which fills the bladder so quickly that the normal urge to urinate is triggered very strongly. SYMPTOMS   Loss of bladder control. You feel the need to urinate and cannot make your body wait.  Sudden, strong urges to urinate.  Urinating 8 or more times a day.  Waking up to urinate two or more times a night. DIAGNOSIS  To decide if you have  overactive bladder, your health care provider will probably:  Ask about symptoms you have noticed.  Ask about your overall health. This will include questions about any medications you are taking.  Do a physical examination. This will help determine if there are obvious blockages or other problems.  Order some tests. These might include:  A blood test to check for diabetes or other health issues that could be contributing to the problem.  Urine testing. This could measure  the flow of urine and the pressure on the bladder.  A test of your neurological system (the brain, spinal cord, and nerves). This is the system that senses the need to urinate. Some of these tests are called flow tests, bladder pressure tests, and electrical measurements of the sphincter muscle.  A bladder test to check whether it is emptying completely when you urinate.  Cystoscopy. This test uses a thin tube with a tiny camera on it. It offers a look inside your urethra and bladder to see if there are problems.  Imaging tests. You might be given a contrast dye and then asked to urinate. X-rays are taken to see how your bladder is working. TREATMENT  An overactive bladder can be treated in many ways. The treatment will depend on the cause. Whether you have a mild or severe case also makes a difference. Often, treatment can be given in your health care provider's office or clinic. Be sure to discuss the different options with your caregiver. They include:  Behavioral treatments. These do not involve medication or surgery:  Bladder training. For this, you would follow a schedule to urinate at regular intervals. This helps you learn to control the urge to urinate. At first, you might be asked to wait a few minutes after feeling the urge. In time, you should be able to schedule bathroom visits an hour or more apart.  Kegel exercises. These exercises strengthen the pelvic floor muscles, which support the bladder. Toning these muscles can help control urination even if the bladder muscles are overactive. A specialist will teach you how to do these exercises correctly. They will require daily practice.  Weight loss. If you are obese or overweight, losing weight might stop your bladder from being overactive. Talk to your health care provider about how many pounds you should lose. Also ask if there is a specific program or method that would work best for you.  Diet change. This might be suggested if  constipation is making your overactive bladder worse. Your health care provider or a nutritionist can explain ways to change what you eat to ease constipation. Other people might need to take in less caffeine or alcohol. Sometimes drinking fewer fluids is needed, too.  Protection. This is not an actual treatment. But, you could wear special pads to take care of any leakage while you wait for other treatments to take effect. This will help you avoid embarrassment.  Physical treatments.  Electrical stimulation. Electrodes will send gentle pulses to the nerves or muscles that help control the bladder. The goal is to strengthen them. Sometimes this is done with the electrodes outside the body. Or, they might be placed inside the body (implanted). This treatment can take several months to have an effect.  Medications. These are usually used along with other treatments. Several medicines are available. Some are injected into the muscles involved in urination. Others come in pill form. Medications sometimes prescribed include:  Anticholinergics. These drugs block the signals that the nerves deliver to the bladder.  This keeps it from releasing urine at the wrong time. Researchers think the drugs might help in other ways, too.  Imipramine. This is an antidepressant. But, it relaxes bladder muscles.  Botox. This is still experimental. Some people believe that injecting it into the bladder muscles will relax them so they work more normally. It has also been injected into the sphincter muscle when the sphincter muscle does not open properly. This is a temporary fix, however. Also, it might make matters worse, especially in older people.  Surgery.  A device might be implanted to help manage your nerves. It works on the nerves that signal when you need to urinate.  Surgery is sometimes needed with electrical stimulation. If the electrodes are implanted, this is done through surgery.  Sometimes repairs need  to be made through surgery. For example, the size of the bladder can be changed. This is usually done in severe cases only. HOME CARE INSTRUCTIONS   Take any medications your health care provider prescribed or suggested. Follow the directions carefully.  Practice any lifestyle changes that are recommended. These might include:  Drinking less fluid or drinking at different times of the day. If you need to urinate often during the night, for example, you may need to stop drinking fluids early in the evening.  Cutting down on caffeine or alcohol. They can both make an overactive bladder worse. Caffeine is found in coffee, tea, and sodas.  Doing Kegel exercises to strengthen muscles.  Losing weight, if that is recommended.  Eating a healthy and balanced diet. This will help you avoid constipation.  Keep a journal or a log. You might be asked to record how much you drink and when, and also when you feel the need to urinate.  Learn how to care for implants or other devices, such as pessaries. SEEK MEDICAL CARE IF:   Your overactive bladder gets worse.  You feel increased pain or irritation when you urinate.  You notice blood in your urine.  You have questions about any medications or devices that your health care provider recommended.  You notice blood, pus, or swelling at the site of any test or treatment procedure.  You have an oral temperature above 102F (38.9C). SEEK IMMEDIATE MEDICAL CARE IF:  You have an oral temperature above 102F (38.9C), not controlled by medicine. Document Released: 05/03/2009 Document Revised: 11/21/2013 Document Reviewed: 05/03/2009 [facility] Patient Information 2015 [place], [place]. This information is not intended to replace advice given to you by your health care provider. Make sure you discuss any questions you have with your health care provider.

## 2015-01-30 NOTE — Progress Notes (Signed)
Subjective:  Patient ID: Bridget [name], female    DOB: Jul 15, 1931  Age: 81 y.o. MRN: [id number]  CC: The primary encounter diagnosis was Urinary hesitancy. Diagnoses of Constipation, unspecified constipation type, Dizziness and giddiness, and Other fatigue were also pertinent to this visit.  HPI Bridget [name] presents for follow up on recent complaints of lower abdominal painm dizziness and urinary frequency.    She was determined to be orthostatic secondary to mild dehydration,  And advised to increase her water intake daily.  Her abdominal pain was accompanied by constipation and was addressed with a cathartic followed by daily use of a bulk forming laxative      Both  Issues have improved but she reports persistent  fatigue.  No history of  severe snoring,   She continues to nocturia x 3 with urgency complicated by hesitancy and inability to void. She reports having to strain to empty  Her bladder .  She avoids drinking water past 6 pm.       Outpatient Prescriptions Prior to Visit  Medication Sig Dispense Refill  . acetaminophen (TYLENOL) 325 MG tablet Take 325 mg by mouth as needed.      . Calcium Carbonate-Vitamin D (CALCIUM + D) 600-200 MG-UNIT TABS Take 1 tablet by mouth daily.      Marland Kitchen HYDROcodone-acetaminophen (NORCO/VICODIN) 5-325 MG per tablet Take 1 tablet by mouth every 6 (six) hours as needed for moderate pain. 60 tablet 0  . Multiple Vitamins-Minerals (CENTRUM SILVER PO) Take 1 tablet by mouth daily.      . pantoprazole (PROTONIX) 40 MG tablet Take 1 tablet (40 mg total) by mouth daily. 30 tablet 1  . predniSONE (STERAPRED UNI-PAK) 10 MG tablet 6 tablets on Day 1 , then reduce by 1 tablet daily until gone 21 tablet 0  . pantoprazole (PROTONIX) 40 MG tablet TAKE ONE TABLET BY MOUTH ONCE DAILY 30 tablet 0   No facility-administered medications prior to visit.    Review of Systems;  Patient denies headache, fevers, malaise, unintentional weight loss, skin rash, eye  pain, sinus congestion and sinus pain, sore throat, dysphagia,  hemoptysis , cough, dyspnea, wheezing, chest pain, palpitations, orthopnea, edema, abdominal pain, nausea, melena, diarrhea, constipation, flank pain, dysuria, hematuria, urinary  Frequency, nocturia, numbness, tingling, seizures,  Focal weakness, Loss of consciousness,  Tremor, insomnia, depression, anxiety, and suicidal ideation.      Objective:  BP 120/62 mmHg  Pulse 72  Temp(Src) 97.9 F (36.6 C) (Oral)  Resp 12  Ht 5\' 5"  (1.651 m)  Wt 157 lb 12.8 oz (71.578 kg)  BMI 26.26 kg/m2  SpO2 97%  BP Readings from Last 3 Encounters:  01/30/15 120/62  01/18/15 104/74  01/09/14 142/80    Wt Readings from Last 3 Encounters:  01/30/15 157 lb 12.8 oz (71.578 kg)  01/18/15 156 lb 4 oz (70.875 kg)  01/09/14 159 lb 13 oz (72.49 kg)    General appearance: alert, cooperative and appears stated age Ears: normal TM's and external ear canals both ears Throat: lips, mucosa, and tongue normal; teeth and gums normal Neck: no adenopathy, no carotid bruit, supple, symmetrical, trachea midline and thyroid not enlarged, symmetric, no tenderness/mass/nodules Back: symmetric, no curvature. ROM normal. No CVA tenderness. Lungs: clear to auscultation bilaterally Heart: regular rate and rhythm, S1, S2 normal, no murmur, click, rub or gallop Abdomen: soft, non-tender; bowel sounds normal; no masses,  no organomegaly Pulses: 2+ and symmetric Skin: Skin color, texture, turgor normal. No rashes or lesions  Lymph nodes: Cervical, supraclavicular, and axillary nodes normal.  No results found for: HGBA1C  Lab Results  Component Value Date   CREATININE 0.80 01/18/2015   CREATININE 0.7 06/23/2013   CREATININE 0.8 02/25/2012    Lab Results  Component Value Date   WBC 6.5 01/18/2015   HGB 13.6 01/18/2015   HCT 39.9 01/18/2015   PLT 365.0 01/18/2015   GLUCOSE 96 01/18/2015   CHOL 171 01/18/2015   TRIG 97.0 01/18/2015   HDL 48.20  01/18/2015   LDLCALC 103* 01/18/2015   ALT 14 01/18/2015   AST 20 01/18/2015   NA 141 01/18/2015   K 4.2 01/18/2015   CL 105 01/18/2015   CREATININE 0.80 01/18/2015   BUN 11 01/18/2015   CO2 29 01/18/2015   TSH 1.140 01/18/2015    Dg Chest 2 View  01/18/2015   CLINICAL DATA:  Night sweats  EXAM: CHEST  2 VIEW  COMPARISON:  None.  FINDINGS: The heart size and mediastinal contours are within normal limits. The aorta is unfolded and ectatic. Both lungs are clear. The visualized skeletal structures are unremarkable.  IMPRESSION: No active cardiopulmonary disease.   Electronically Signed   By: Conchita Paris M.D.   On: 01/18/2015 14:36   Dg Abd 2 Views  01/18/2015   CLINICAL DATA:  Chronic lower abdominal discomfort, chronic constipation, history of hysterectomy thirty years ago, history of diverticulosis.  EXAM: ABDOMEN - 2 VIEW  COMPARISON:  None.  FINDINGS: There is increased colonic stool burden. There is no small or large bowel obstruction. There are no abnormal soft tissue calcifications. There are degenerative changes at the L4-5 at L5-S1 disc levels.  IMPRESSION: Increase colonic stool burden is consistent with clinical constipation. No acute intra-abdominal abnormality is observed.   Electronically Signed   By: David  Martinique M.D.   On: 01/18/2015 14:27    Assessment & Plan:   Problem List Items Addressed This Visit      Unprioritized   Constipation    Improved with daily regimen of BFLs      Dizziness and giddiness    Secondary to orthostasis ,  Resolved with incresaed hydration       Fatigue    Likely due to interrupted sleep due to nocturia x 3  , given normal renal, thyroid functio nand no signs of anemia .Marland Kitchen  Will address nocturia.      Urinary hesitancy - Primary    With overactivity and urgency,  Followed by inability to void when full,  Bladder scan with post void residual advise.d       Relevant Orders   Bladder Scan ([facility])     A total of 25 minutes of face to  face time was spent with patient more than half of which was spent in counselling about the above mentioned conditions  and coordination of care  I am having Ms. Latona maintain her Multiple Vitamins-Minerals (CENTRUM SILVER PO), Calcium Carbonate-Vitamin D, acetaminophen, predniSONE, HYDROcodone-acetaminophen, and pantoprazole.  No orders of the defined types were placed in this encounter.    Medications Discontinued During This Encounter  Medication Reason  . pantoprazole (PROTONIX) 40 MG tablet Completed Course    Follow-up: No Follow-up on file.   Crecencio Mc, MD

## 2015-01-30 NOTE — Progress Notes (Signed)
Pre visit review using our clinic review tool, if applicable. No additional management support is needed unless otherwise documented below in the visit note. 

## 2015-02-02 ENCOUNTER — Encounter: Payer: Self-pay | Admitting: Internal Medicine

## 2015-02-02 DIAGNOSIS — R5383 Other fatigue: Secondary | ICD-10-CM | POA: Insufficient documentation

## 2015-02-02 NOTE — Assessment & Plan Note (Signed)
Secondary to orthostasis ,  Resolved with incresaed hydration

## 2015-02-02 NOTE — Assessment & Plan Note (Signed)
Likely due to interrupted sleep due to nocturia x 3  , given normal renal, thyroid functio nand no signs of anemia .Marland Kitchen  Will address nocturia.

## 2015-02-02 NOTE — Assessment & Plan Note (Signed)
Improved with daily regimen of BFLs

## 2015-02-14 ENCOUNTER — Other Ambulatory Visit: Payer: Self-pay | Admitting: Internal Medicine

## 2015-02-14 DIAGNOSIS — R339 Retention of urine, unspecified: Secondary | ICD-10-CM

## 2015-02-15 ENCOUNTER — Ambulatory Visit
Admission: RE | Admit: 2015-02-15 | Discharge: 2015-02-15 | Disposition: A | Discharge status: Home / Self Care | Payer: PPO | Source: Ambulatory Visit | Attending: Internal Medicine | Admitting: Internal Medicine

## 2015-02-15 DIAGNOSIS — R339 Retention of urine, unspecified: Secondary | ICD-10-CM | POA: Insufficient documentation

## 2015-02-20 ENCOUNTER — Telehealth: Payer: Self-pay | Admitting: *Deleted

## 2015-02-20 NOTE — Telephone Encounter (Signed)
Patient insurance sent fax for records, for 01/18/15 OV faxed OV notes as requested.

## 2015-02-22 ENCOUNTER — Other Ambulatory Visit: Payer: Self-pay | Admitting: Internal Medicine

## 2015-02-22 ENCOUNTER — Telehealth: Payer: Self-pay | Admitting: Internal Medicine

## 2015-02-22 NOTE — Telephone Encounter (Signed)
Pt states her medication was called in but no response per pharmacist. Pharmacy [facility] on [address] rm

## 2015-03-18 ENCOUNTER — Other Ambulatory Visit: Payer: Self-pay | Admitting: Internal Medicine

## 2015-04-02 ENCOUNTER — Ambulatory Visit: Payer: PPO | Attending: Family Medicine

## 2015-04-02 ENCOUNTER — Encounter: Payer: Self-pay | Admitting: Physical Therapy

## 2015-04-02 DIAGNOSIS — R29818 Other symptoms and signs involving the nervous system: Secondary | ICD-10-CM | POA: Diagnosis present

## 2015-04-02 DIAGNOSIS — R42 Dizziness and giddiness: Secondary | ICD-10-CM | POA: Insufficient documentation

## 2015-04-02 DIAGNOSIS — R2689 Other abnormalities of gait and mobility: Secondary | ICD-10-CM

## 2015-04-02 NOTE — Therapy (Signed)
[place] [facility] SERVICES [address] [place], [place], [address] Phone: [phone]   Fax:  [phone]  Physical Therapy Evaluation  Patient Details  Name: Bridget [name] MRN: [id number] Date of Birth: [birth year] Referring Provider:  Briscoe Deutscher, DO  Encounter Date: 04/02/2015      PT End of Session - 04/02/15 1556    Visit Number 1   Number of Visits 5   Date for PT Re-Evaluation 05/04/15   Authorization Type no go codes   PT Start Time 1600   PT Stop Time 1700   PT Time Calculation (min) 60 min   Equipment Utilized During Treatment Gait belt   Activity Tolerance Patient tolerated treatment well   Behavior During Therapy [facility] for tasks assessed/performed      Past Medical History  Diagnosis Date  . GERD (gastroesophageal reflux disease)   . Diverticulosis of colon without diverticulitis     Past Surgical History  Procedure Laterality Date  . Oophorectomy      Bilateral  . Abdominal hysterectomy      Not due to cancer, due to fibroids, BSO    There were no vitals filed for this visit.  Visit Diagnosis:  Dizziness and giddiness - Plan: PT plan of care cert/re-cert  Balance disorder - Plan: PT plan of care cert/re-cert      Subjective Assessment - 04/03/15 1703    Subjective "I've been dizzy"   Pertinent History Pt states that one morning she had just finished brushing her teeth and she got very dizzy and nauseated. Pt states that she remained dizzy for approximately one hour after which her symptoms resolved and didn't return until approximately 1 month later (early AM 03/25/15). Pt states that she woke up early in the morning and went to the bathroom and when she got back in bed her symptoms returned. She fell asleep and when she woke up she still felt extremely dizzy. Pt went to the urgent care and they put her on Meclizine which has helped her symptoms. She took meclizine for 4 days and has not had any symptoms  since that time (for the last 4 days).   Currently in Pain? No/denies     VESTIBULAR AND BALANCE EVALUATION  Onset Date:  "a couple months"  HISTORY:  Symptoms at onset:  Description of dizziness: (vertigo, unsteadiness, lightheadedness, falling, general unsteadiness, aural fullness)__Vertigo ("spinning")________ Symptom nature: (motion provoked/positional/spontaneous/constant, variable, intermittent) ___spontaneous___ Frequency: No symptoms in 4 days, prior to that it was approximately once/month? Duration: Hours  Provocative Factors: Unknown Easing Factors: Resting Progression of symptoms: (better, worse, no change since onset): Better History of similar episodes:____No_______ Falls (yes/no):____No________ Number of falls in past 6 months:___0___  Subjective history of current problem: Pt states that one morning she had just finished brushing her teeth and she got very dizzy and nauseated. Pt states that she remained dizzy for approximately one hour after which her symptoms resolved and didn't return until approximately 1 month later (early AM 03/25/15). Pt states that she woke up early in the morning and went to the bathroom and when she got back in bed her symptoms returned. She fell asleep and when she woke up she still felt extremely dizzy. Pt went to the urgent care and they put her on Meclizine which has helped her symptoms. She took meclizine for 4 days and has not had any symptoms since that time (for the last 4 days).   Subjective Prior Functional Level: _No deficits_____  Auditory complaints (tinnitus, pain, drainage): No  Vision: (last eye exam, diplopia, recent changes)_No____  Current Symptoms: (vertigo, N & V, dysarthria, dysphagia, drop attacks, bowel and bladder changes, recent weight loss/gain, lightheadedness, headache, rocking, general unsteadiness, imbalance, tilting, swaying, veering, dizzy, oscillopsia, migraines): nausea, vertigo, intermittent headaches (denies  migraines). Additional ROS is negative for red flags.    EXAMINATION:  POSTURE:__Forward head and neck posture with excessive thoracic kyphosis _____  NEUROLOGICAL SCREEN:   Level Dermatome R L Myotome R L Reflex R L  C3 Anterior Neck N N Sidebend C2-3   Jaw CN V    C4 Top of Shoulder N N Shoulder Shrug C4 N N Hoffman's UMN N   C5 Lateral Upper Arm N N Shoulder ABD C4-5 N N Biceps C5-6 2+ 2+  C6 Lateral Arm/ Thumb N N Arm Flex/ Wrist Ext C5-6 N N Brachiorad. C5-6    C7 Middle Finger N N Arm Ext//Wrist Flex C6-7 N N Triceps C7    C8 4th & 5th Finger N N Flex/ Ext Carpi Ulnaris C8 N N Patella 1+ 1+  T1 Medial Arm N N Interossei T1 N N Gastrocnemius 1+ 1+  L2 Medial thigh/groin N N Illiopsoas (L2-3)  N     L3 Lower thigh/med.knee N N Quadriceps (L3-4) N N     L4 Medial leg/lat thigh N N Tibialis Ant (L4-5) N N     L5 Lat. leg & dorsal foot N N EHL (L5)       S1 post/lat foot/thigh/leg N N Gastrocnemius (S1-2)       S2 Post./med. thigh & leg N N Hamstrings (L4-S3) N N Babinski     Pt refuses R hip flexor strength testing due to pain in R knee> No clonus present in UE/LE. No rigidity or spasticity noted.   N=normal  Ab=abnormal  Clonus = 4+ (Reflexes only)  Hyper = 3+      Normal = 2+     Hypo = 1+   Absent=0   SOMATOSENSORY:        Sensation           Intact      Diminished         Absent    Hypersenstive  Light touch N     Sharp / dull      Proprioception      Kinesthesia      Other:      Any N & T in extremities or weakness: dENIES      COORDINATION: Rapid Alt Movements:   Normal Heel to Shin:    Not tested Finger to Nose:   Normal Pronator Drift:  Normal   MUSCULOSKELETAL SCREEN: Cervical Spine ROM:  ~ WNL's ~ Impaired   Decreased cervical extension, painless. Otherwise rotation and lateral flexion are full and painless          ROM: No gross deficits noted. Pt only allows for limited R knee flexion/extension due to history of chronic knee pain  MMT:  Grossly 4+/5  throughout bilateral UE/LE without gross focal weakness. Unable to fully test R knee due to pain  Functional Mobility:  Gait:___Appropriate speed and step length noted. Mildly antalgic on RLE due to history of chronic R knee pain. Good scanning of visual environment noted during gait. _  Balance:      POSTURAL CONTROL TESTS:    Clinical Test of Sensory Interaction for Balance    (CTSIB):  CONDITION TIME STRATEGY SWAY  Eyes open, firm surface >  30 seconds  0  Eyes closed, firm surface >30 seconds  1+  Eyes open, foam surface >30 seconds  1+  Eyes closed, foam surface >30 seconds  2+     OCULOMOTOR / VESTIBULAR TESTING:  Oculomotor Exam- Room Light  Normal Abnormal Comments  Ocular Alignment N    Ocular ROM N    Spontaneous Nystagmus N    End-Gaze Nystagmus N  Age appropriate  Smooth Pursuit  A Not smooth, frequent saccades  Saccades  A Poor trajectory  VOR  A "Doubling" with slow head turns  VOR Cancellation N    Left Head Thrust N    Right Head Thrust N    Head Shaking Nystagmus     Static Acuity     Dynamic Acuity      Oculomotor Exam- Fixation Suppressed  Normal Abnormal Comments  Ocular Alignment N    Ocular ROM N    Spontaneous Nystagmus N    End-Gaze Nystagmus N  Age-appropriate  Left Head Thrust     Right Head Thrust     Head Shaking Nystagmus N     BPPV TESTS:  Symptoms Duration Intensity Nystagmus  L Dix-Hallpike Negative     R Dix-Hallpike Negative     L Head Roll Negative     R Head Roll Negative     L Sidelying Test      R Sidelying Test        MEASURES:  Results Comments  DHI 16/100 Low handicap  ABC Scale 68.125% Decreased confidence  DGI 22/24 Minimal impairment  10 meter Walking Speed    FTSTS    SLS    Berg Balance Test 52 High level deficits  TUG    TUG- Carry    TUG- Cognitive              [facility] PT Assessment - 04/03/15 1705    Assessment   Medical Diagnosis Vertigo   Onset Date/Surgical Date 02/22/15  approximately    Next MD Visit Unknown   Prior Therapy No   Precautions   Precautions None   Restrictions   Weight Bearing Restrictions No   Balance Screen   Has the patient fallen in the past 6 months No   Has the patient had a decrease in activity level because of a fear of falling?  No   Is the patient reluctant to leave their home because of a fear of falling?  No   Home Ecologist residence   Living Arrangements Spouse/significant other   Available Help at Discharge Family   Prior Function   Level of [place] Retired   Associate Professor   Overall Cognitive Status Within Functional Limits for tasks assessed  History is somewhat disjointed   Standardized Balance Assessment   Standardized Balance Assessment Berg Balance Test;Dynamic Gait Index   Berg Balance Test   Sit to Stand Able to stand without using hands and stabilize independently   Standing Unsupported Able to stand safely 2 minutes   Sitting with Back Unsupported but Feet Supported on Floor or Stool Able to sit safely and securely 2 minutes   Stand to Sit Sits safely with minimal use of hands   Transfers Able to transfer safely, minor use of hands   Standing Unsupported with Eyes Closed Able to stand 10 seconds safely   Standing Ubsupported with Feet Together Able to place feet together independently and stand 1 minute safely   From Standing,  Reach Forward with Outstretched Arm Can reach confidently >25 cm (10")   From Standing Position, Pick up Object from [place] to pick up shoe safely and easily   From Standing Position, Turn to Look Behind Over each Shoulder Looks behind from both sides and weight shifts well   Turn 360 Degrees Able to turn 360 degrees safely in 4 seconds or less   Standing Unsupported, Alternately Place Feet on Step/Stool Able to stand independently and safely and complete 8 steps in 20 seconds   Standing Unsupported, One Foot in Front Able to plae foot ahead  of the other independently and hold 30 seconds   Standing on One Leg Tries to lift leg/unable to hold 3 seconds but remains standing independently   Total Score 52   Dynamic Gait Index   Level Surface Normal   Change in Gait Speed Normal   Gait with Horizontal Head Turns Normal   Gait with Vertical Head Turns Normal   Gait and Pivot Turn Normal   Step Over Obstacle Normal   Step Around Obstacles Normal   Steps Moderate Impairment   Total Score 22   DGI comment: Pt limited with stairs due to R knee pain      TREATMENT Pt provided with VOR x 1 horizontal as HEP. Instructed pt in exercise, provided handout, and performed x 60 seconds with patient.                      PT Education - 04/02/15 1555    Education provided Yes   Education Details HEP provided, discussed plan of care   Person(s) Educated Patient   Methods Explanation   Comprehension Verbalized understanding             PT Long Term Goals - 04/03/15 1728    PT LONG TERM GOAL #1   Title Pt will demonstrate increase in ABC by at least 13% to demonstrate significant improvement in balance confidence by 05/04/15   Baseline 04/02/15: 68.125%   Status New   PT LONG TERM GOAL #2   Title Pt will report 0 episodes of vertigo in order to resume normal function at home without symptoms by 05/04/15   Baseline 04/02/15: once/month   Status New   PT LONG TERM GOAL #3   Title Pt will demonstrate single leg balance of >10 seconds to decrease risk for falls by 05/04/15   Baseline 04/02/15: 1.5 seconds   Status New               Plan - 04/02/15 1556    Clinical Impression Statement Pt was referred for vertigo. She is currently reporting resolution of symptoms after 4 days of meclizine. Unable to reproduce patient symptoms today during therapy examination. Marye Round and horizontal canal testing are negative for BPPV. Pt does present with full extraocular ROM but smooth pursuit quality is poor and  horizontal saccades present with poor trajectory. Both of these findings are more consistent with central contribution etiology. Pt does have doubling of her visual field with horizontal head turns which may indicate some vestibular impairments. She also presents with high level balance deficits, especially in single leg stance. Pt will benefit from skiled PT therapy to address deficits in balance in order to prevent recurrence of symptoms and decrease risk for future falls.    Pt will benefit from skilled therapeutic intervention in order to improve on the following deficits Dizziness   Rehab Potential Excellent   Clinical Impairments  Affecting Rehab Potential Positive: Negative:   PT Frequency 1x / week   PT Duration 6 weeks   PT Treatment/Interventions Canalith Repostioning;Gait training;Therapeutic activities;Therapeutic exercise;Balance training;Neuromuscular re-education;Patient/family education;Manual techniques;Vestibular   Consulted and Agree with Plan of Care Patient         Problem List Patient Active Problem List   Diagnosis Date Noted  . Fatigue 02/02/2015  . Constipation 01/20/2015  . Dizziness and giddiness 01/20/2015  . Multinodular goiter 06/23/2013  . Urinary hesitancy 01/22/2013  . Diverticulosis of colon without diverticulitis   . Menopausal sweats 02/25/2012  . Routine general medical examination at a health care facility 02/25/2012  . Osteopenia 08/01/2011  . GERD (gastroesophageal reflux disease)    Phillips Grout PT, DPT   Tymier Lindholm 04/03/2015, 5:34 PM  [place] [facility] SERVICES [address] [place], [place], [address] Phone: [phone]   Fax:  [phone]

## 2015-04-03 NOTE — Patient Instructions (Signed)
Provided with VOR x 1 horizontal handout, 60 seconds x 3, 3 times/day

## 2015-04-09 ENCOUNTER — Ambulatory Visit: Payer: PPO

## 2015-04-09 DIAGNOSIS — R42 Dizziness and giddiness: Secondary | ICD-10-CM | POA: Diagnosis not present

## 2015-04-09 DIAGNOSIS — R2689 Other abnormalities of gait and mobility: Secondary | ICD-10-CM

## 2015-04-09 NOTE — Patient Instructions (Signed)
Single Leg - Eyes Open   Holding support, lift right leg while maintaining balance over other leg. Progress to removing hands from support surface for longer periods of time. Hold____ seconds. Repeat ____ times per session. Do ____ sessions per day.    Feet Heel-Toe "Tandem", Head Motion - Eyes Open   With eyes open, right foot directly in front of the other, move head slowly: up and down. Repeat ____ times per session. Do ____ sessions per day.

## 2015-04-09 NOTE — Therapy (Signed)
[place] MAIN [facility] SERVICES [address] [place], [place], [address] Phone: [phone]   Fax:  [phone]  Physical Therapy Treatment  Patient Details  Name: Bridget [name] MRN: [id number] Date of Birth: [birth year] Referring Provider:  Briscoe Deutscher, DO  Encounter Date: 04/09/2015      PT End of Session - 04/09/15 1258    Visit Number 2   Number of Visits 5   Date for PT Re-Evaluation 05/04/15   Authorization Type no g codes   PT Start Time 1300   PT Stop Time 1350   PT Time Calculation (min) 50 min   Equipment Utilized During Treatment Gait belt   Activity Tolerance Patient tolerated treatment well   Behavior During Therapy [facility] for tasks assessed/performed      Past Medical History  Diagnosis Date  . GERD (gastroesophageal reflux disease)   . Diverticulosis of colon without diverticulitis     Past Surgical History  Procedure Laterality Date  . Oophorectomy      Bilateral  . Abdominal hysterectomy      Not due to cancer, due to fibroids, BSO    There were no vitals filed for this visit.  Visit Diagnosis:  Dizziness and giddiness  Balance disorder      Subjective Assessment - 04/09/15 1258    Subjective Pt states she has been well since evaluation. She has not had any bouts of dizziness and is performing HEP without issue. No specific questions or concerns at this time.    Pertinent History Pt states that one morning she had just finished brushing her teeth and she got very dizzy and nauseated. Pt states that she remained dizzy for approximately one hour after which her symptoms resolved and didn't return until approximately 1 month later (early AM 03/25/15). Pt states that she woke up early in the morning and went to the bathroom and when she got back in bed her symptoms returned. She fell asleep and when she woke up she still felt extremely dizzy. Pt went to the urgent care and they put her on Meclizine which has helped  her symptoms. She took meclizine for 4 days and has not had any symptoms since that time (for the last 4 days).   Currently in Pain? No/denies            [facility] PT Assessment - 04/09/15 0001    Standardized Balance Assessment   Standardized Balance Assessment Timed Up and Go Test;Five Times Sit to Stand   Five times sit to stand comments  16.5 seconds, above cut-off for falls   Timed Up and Go Test   Normal TUG (seconds) 11.14    TREATMENT  VOR Reviewed VOR x 1 horizontal with patient. Performed for 60 seconds x 2; Airex WBOS and then NBOS VOR x 1 horizontal 60 seconds x 2;  Airex NBOS balance with horizontal and vertical head turns x 60 seconds each; modified tandem (alternating LE forward) with horizontal and vertical head turns x 60 seconds each; Cone tapping with 1, 2, and 3 cone sequencing;  Ball Toss Ambulation in hallway with horizontal and vertical head turns 80' x 2; Ambulation in hallway with vertical and horizontal ball tosses 80' x 2 each;  TUG: 11.14, 5TSTS: 16.5 seconds                          PT Education - 04/09/15 1258    Education provided Yes  Education Details HEP progression   Person(s) Educated Patient   Methods Explanation;Demonstration;Handout   Comprehension Verbalized understanding;Returned demonstration             PT Long Term Goals - 04/03/15 1728    PT LONG TERM GOAL #1   Title Pt will demonstrate increase in ABC by at least 13% to demonstrate significant improvement in balance confidence by 05/04/15   Baseline 04/02/15: 68.125%   Status New   PT LONG TERM GOAL #2   Title Pt will report 0 episodes of vertigo in order to resume normal function at home without symptoms by 05/04/15   Baseline 04/02/15: once/month   Status New   PT LONG TERM GOAL #3   Title Pt will demonstrate single leg balance of >10 seconds to decrease risk for falls by 05/04/15   Baseline 04/02/15: 1.5 seconds   Status New                Plan - 04/09/15 1300    Clinical Impression Statement Unable to provoke dizziness today with all activities. Pt demonstrates difficulty with single leg balance especially on Airex pad. She denies any further dizziness since PT evaluation. Pt provided with HEP progression for balance and encouraged to follow-up as scheduled.    Pt will benefit from skilled therapeutic intervention in order to improve on the following deficits Dizziness   Rehab Potential Excellent   Clinical Impairments Affecting Rehab Potential Positive: motivation Negative: central signs   PT Frequency 1x / week   PT Duration 6 weeks   PT Treatment/Interventions Canalith Repostioning;Gait training;Therapeutic activities;Therapeutic exercise;Balance training;Neuromuscular re-education;Patient/family education;Manual techniques;Vestibular   PT Next Visit Plan progress balance exercises, continue VOR x 1 progression   PT Home Exercise Plan VOR x 1 horizontal in NBOS, single leg balance, semi-tandem progression with horizontal head turns   Consulted and Agree with Plan of Care Patient        Problem List Patient Active Problem List   Diagnosis Date Noted  . Fatigue 02/02/2015  . Constipation 01/20/2015  . Dizziness and giddiness 01/20/2015  . Multinodular goiter 06/23/2013  . Urinary hesitancy 01/22/2013  . Diverticulosis of colon without diverticulitis   . Menopausal sweats 02/25/2012  . Routine general medical examination at a health care facility 02/25/2012  . Osteopenia 08/01/2011  . GERD (gastroesophageal reflux disease)    Phillips Grout PT, DPT   Huprich,Jason 04/09/2015, 2:54 PM  [place] MAIN [facility] SERVICES [address] [place], [place], [address] Phone: [phone]   Fax:  [phone]

## 2015-04-16 ENCOUNTER — Ambulatory Visit: Payer: PPO

## 2015-04-16 ENCOUNTER — Encounter: Payer: Self-pay | Admitting: Physical Therapy

## 2015-04-16 DIAGNOSIS — R2689 Other abnormalities of gait and mobility: Secondary | ICD-10-CM

## 2015-04-16 DIAGNOSIS — R42 Dizziness and giddiness: Secondary | ICD-10-CM

## 2015-04-16 NOTE — Patient Instructions (Addendum)
SIT TO STAND: Feet Narrow   Place feet apart together. Lean chest forward. Raise hips and straighten knees to stand. _5__ reps per set, perform twice, Perform _twice_ per day, _7__ days per week. Keep chair in front for support as needed.

## 2015-04-16 NOTE — Therapy (Signed)
[place] MAIN [facility] SERVICES [address] [place], [place], [address] Phone: [phone]   Fax:  [phone]  Physical Therapy Treatment  Patient Details  Name: Bridget [name] MRN: [id number] Date of Birth: 1931/07/19 Referring Provider:  Briscoe Deutscher, DO  Encounter Date: 04/16/2015      PT End of Session - 04/16/15 1259    Visit Number 3   Number of Visits 5   Date for PT Re-Evaluation 05/04/15   Authorization Type no g codes   PT Start Time 1300   PT Stop Time 1346   PT Time Calculation (min) 46 min   Equipment Utilized During Treatment Gait belt   Activity Tolerance Patient tolerated treatment well   Behavior During Therapy [facility] for tasks assessed/performed      Past Medical History  Diagnosis Date  . GERD (gastroesophageal reflux disease)   . Diverticulosis of colon without diverticulitis     Past Surgical History  Procedure Laterality Date  . Oophorectomy      Bilateral  . Abdominal hysterectomy      Not due to cancer, due to fibroids, BSO    There were no vitals filed for this visit.  Visit Diagnosis:  Dizziness and giddiness  Balance disorder      Subjective Assessment - 04/16/15 1258    Subjective Pt denies any further bouts of dizziness. She is performing HEP but was unable to perform single leg balance due to R knee and R hip pain. No specific questions or concerns at this time.    Pertinent History Pt states that one morning she had just finished brushing her teeth and she got very dizzy and nauseated. Pt states that she remained dizzy for approximately one hour after which her symptoms resolved and didn't return until approximately 1 month later (early AM 03/25/15). Pt states that she woke up early in the morning and went to the bathroom and when she got back in bed her symptoms returned. She fell asleep and when she woke up she still felt extremely dizzy. Pt went to the urgent care and they put her on Meclizine  which has helped her symptoms. She took meclizine for 4 days and has not had any symptoms since that time (for the last 4 days).   Currently in Pain? Yes   Pain Score 7    Pain Location Back  OA pain   Pain Onset More than a month ago   Pain Frequency Intermittent       TREATMENT  VOR Airex NBOS VOR x 1 horizontal 60 seconds x 2 with conflicting background; VOR x 1 horizontal with forward/retro ambulation 35' x 2 each;  Airex NBOS balance with horizontal head turns followed by head and body turns x 60 seconds each; NBOS with vertical head turns x 60 seconds; Modified tandem (alternating LE forward) with horizontal head turns followed by head/body turns x 60 seconds each; Stair tapping alternating LE; Step-ups from Airex to 6" step with Airex on top. Airex ball tosses in NBOS with low/high tosses as well as R/L. Repeated in semi-tandem stance alternating LE forward;  Kohl's ambulation with vertical and horizontal ball tosses 80' x 2 each;  Strength Sit to stand x 5 without UE support (added to HEP);               PT Education - 04/16/15 1259    Education provided Yes   Education Details HEP progression   Person(s) Educated Patient  Methods Explanation;Demonstration   Comprehension Verbalized understanding;Returned demonstration             PT Long Term Goals - 04/03/15 1728    PT LONG TERM GOAL #1   Title Pt will demonstrate increase in ABC by at least 13% to demonstrate significant improvement in balance confidence by 05/04/15   Baseline 04/02/15: 68.125%   Status New   PT LONG TERM GOAL #2   Title Pt will report 0 episodes of vertigo in order to resume normal function at home without symptoms by 05/04/15   Baseline 04/02/15: once/month   Status New   PT LONG TERM GOAL #3   Title Pt will demonstrate single leg balance of >10 seconds to decrease risk for falls by 05/04/15   Baseline 04/02/15: 1.5 seconds   Status New               Plan  - 04/16/15 1259    Clinical Impression Statement Still unable to provoke dizziness today with all activities. Discontinued single leg balance due to R knee pain and added sit to stand without UE support. Pt has difficulty with retroambulation as well as modified tandem balance with head/body turns. Pt encouraged to follow-up as scheduled.    Pt will benefit from skilled therapeutic intervention in order to improve on the following deficits Dizziness   Rehab Potential Excellent   Clinical Impairments Affecting Rehab Potential Positive: motivation Negative: central signs   PT Frequency 1x / week   PT Duration 6 weeks   PT Treatment/Interventions Canalith Repostioning;Gait training;Therapeutic activities;Therapeutic exercise;Balance training;Neuromuscular re-education;Patient/family education;Manual techniques;Vestibular   PT Next Visit Plan progress balance exercises, potential discharge   PT Home Exercise Plan VOR x 1 horizontal in NBOS, sit to stand without UE support, semi-tandem progression with horizontal head turns   Consulted and Agree with Plan of Care Patient        Problem List Patient Active Problem List   Diagnosis Date Noted  . Fatigue 02/02/2015  . Constipation 01/20/2015  . Dizziness and giddiness 01/20/2015  . Multinodular goiter 06/23/2013  . Urinary hesitancy 01/22/2013  . Diverticulosis of colon without diverticulitis   . Menopausal sweats 02/25/2012  . Routine general medical examination at a health care facility 02/25/2012  . Osteopenia 08/01/2011  . GERD (gastroesophageal reflux disease)     Phillips Grout PT, DPT   Huprich,Jason 04/16/2015, 1:52 PM  [place] MAIN [facility] SERVICES [address] [place], [place], [address] Phone: [phone]   Fax:  [phone]

## 2015-04-23 ENCOUNTER — Encounter: Payer: PPO | Admitting: Physical Therapy

## 2015-05-01 ENCOUNTER — Ambulatory Visit: Payer: PPO | Attending: Family Medicine

## 2015-05-01 DIAGNOSIS — R29818 Other symptoms and signs involving the nervous system: Secondary | ICD-10-CM | POA: Insufficient documentation

## 2015-05-01 DIAGNOSIS — R2689 Other abnormalities of gait and mobility: Secondary | ICD-10-CM

## 2015-05-01 DIAGNOSIS — R42 Dizziness and giddiness: Secondary | ICD-10-CM | POA: Diagnosis not present

## 2015-05-01 NOTE — Therapy (Signed)
[place] MAIN [facility] SERVICES [address] [place], [place], [address] Phone: [phone]   Fax:  [phone]  Physical Therapy Treatment/Discharge Summary  Patient Details  Name: Bridget [name] MRN: [id number] Date of Birth: 12/25/1930 Referring Provider:  Briscoe Deutscher, DO  Encounter Date: 05/01/2015      PT End of Session - 05/01/15 1340    Visit Number 4   Number of Visits 5   Date for PT Re-Evaluation 05/04/15   Authorization Type --   PT Start Time 0923   PT Stop Time 1415   PT Time Calculation (min) 42 min   Equipment Utilized During Treatment Gait belt   Activity Tolerance Patient tolerated treatment well   Behavior During Therapy [facility] for tasks assessed/performed      Past Medical History  Diagnosis Date  . GERD (gastroesophageal reflux disease)   . Diverticulosis of colon without diverticulitis     Past Surgical History  Procedure Laterality Date  . Oophorectomy      Bilateral  . Abdominal hysterectomy      Not due to cancer, due to fibroids, BSO    There were no vitals filed for this visit.  Visit Diagnosis:  Dizziness and giddiness  Balance disorder      Subjective Assessment - 05/01/15 1337    Subjective Pt denies any further episodes of dizziness. She reports that she has gotten stronger since starting therapy and is now able to stand up from her couch without using her arms. Pt reports that her husband was admitted to the hospital over the weekend but has discharged home and is doing well. With the exception of the time her husband was in the hospital she has been able to perform her HEP. No specific questions or concerns at this time.    Pertinent History Pt states that one morning she had just finished brushing her teeth and she got very dizzy and nauseated. Pt states that she remained dizzy for approximately one hour after which her symptoms resolved and didn't return until approximately 1 month later  (early AM 03/25/15). Pt states that she woke up early in the morning and went to the bathroom and when she got back in bed her symptoms returned. She fell asleep and when she woke up she still felt extremely dizzy. Pt went to the urgent care and they put her on Meclizine which has helped her symptoms. She took meclizine for 4 days and has not had any symptoms since that time (for the last 4 days).   Currently in Pain? Yes   Pain Score 7    Pain Location Back   Pain Type Chronic pain   Pain Onset More than a month ago   Pain Frequency Intermittent   Pain Relieving Factors wearing back brace today   Multiple Pain Sites No            [facility] PT Assessment - 05/01/15 1409    Observation/Other Assessments   Other Surveys  Other Surveys   Activities of Balance Confidence Scale (ABC Scale)  86.9%   Berg Balance Test   Sit to Stand Able to stand without using hands and stabilize independently   Standing Unsupported Able to stand safely 2 minutes   Sitting with Back Unsupported but Feet Supported on Floor or Stool Able to sit safely and securely 2 minutes   Stand to Sit Sits safely with minimal use of hands   Transfers Able to transfer safely, minor use of  hands   Standing Unsupported with Eyes Closed Able to stand 10 seconds safely   Standing Ubsupported with Feet Together Able to place feet together independently and stand 1 minute safely   From Standing, Reach Forward with Outstretched Arm Can reach confidently >25 cm (10")   From Standing Position, Pick up Object from Floor Able to pick up shoe safely and easily   From Standing Position, Turn to Look Behind Over each Shoulder Looks behind from both sides and weight shifts well   Turn 360 Degrees Able to turn 360 degrees safely in 4 seconds or less   Standing Unsupported, Alternately Place Feet on Step/Stool Able to stand independently and safely and complete 8 steps in 20 seconds   Standing Unsupported, One Foot in Front Able to plae foot  ahead of the other independently and hold 30 seconds   Standing on One Leg Able to lift leg independently and hold 5-10 seconds   Total Score 54   Berg comment: single leg stance time improved       TREATMENT  Physical Performance Completed ABC and BERG. See above for results. Discussed results with patient. Updated goals with patient; Reviewed and reinforced HEP;  Strength Sit to stand x 10 without UE support, considerable improvement in LE strength noted;  VOR Airex NBOS VOR x 1 horizontal 60 seconds x 2 ; VOR x 1 horizontal with forward/retro ambulation 35' x 2 each;  Tandem Progression Modified tandem (alternating LE forward) with horizontal head turns followed by head/body turns x 30 seconds each; Full tandem (alternating LE forward) with horizontal head and body turns; forward and backward tandem gait in // bars;  Ball Toss Forward ambulation with vertical and horizontal ball tosses 80' x 4;  Discussed discharge, plan of care if symptoms return, and continuation of HEP;                       PT Education - 05/01/15 1339    Education provided Yes   Education Details Reinforced/progressed HEP, discharge plans   Person(s) Educated Patient   Methods Explanation;Demonstration   Comprehension Verbalized understanding;Returned demonstration             PT Long Term Goals - 05/01/15 1341    PT LONG TERM GOAL #1   Title Pt will demonstrate increase in ABC by at least 13% to demonstrate significant improvement in balance confidence by 05/04/15   Baseline 04/02/15: 68.125%; 05/01/15: 86.9%   Status Achieved   PT LONG TERM GOAL #2   Title Pt will report 0 episodes of vertigo in order to resume normal function at home without symptoms by 05/04/15   Baseline 04/02/15: once/month, 05/01/15: 0 episodes   Status Achieved   PT LONG TERM GOAL #3   Title Pt will demonstrate single leg balance of >10 seconds to decrease risk for falls by 05/04/15   Baseline  04/02/15: 1.5 seconds 05/01/15: 5.5 on R side, 9.9 on L side (best of 3 trials)   Status Partially Met               Plan - 05/01/15 1340    Clinical Impression Statement Pt will be discharged on this date. She refuses further episodes of dizziness and reports improvment in leg strength and balance. Pt is able to demonstrate sit to stand from regular height chair x 10 without UE assistance. Single leg balance improved to 9.9 seconds from 1.5 seconds at time of initial evaluation. BERG improved from 52/56  to 54/56. Overall balance confidence on ABC self report survey increased from 68% to 86%. Pt is independent with her home exercise program and feels confident continuing without assistance. Pt educated to follow-up with MD if symptoms return.    Pt will benefit from skilled therapeutic intervention in order to improve on the following deficits Dizziness   Rehab Potential Excellent   Clinical Impairments Affecting Rehab Potential Positive: motivation Negative: central signs   PT Frequency 1x / week   PT Duration 6 weeks   PT Treatment/Interventions Canalith Repostioning;Gait training;Therapeutic activities;Therapeutic exercise;Balance training;Neuromuscular re-education;Patient/family education;Manual techniques;Vestibular   PT Next Visit Plan Discharge   PT Home Exercise Plan VOR x 1 horizontal in NBOS, sit to stand without UE support, semi-tandem progression with horizontal head turns, forward/backward tandem gait   Consulted and Agree with Plan of Care Patient          G-Codes - May 23, 2015 1439    Functional Assessment Tool Used BERG, ABC, clinical judgement   Functional Limitation Mobility: Walking and moving around   Mobility: Walking and Moving Around Goal Status [phone]) At least 1 percent but less than 20 percent impaired, limited or restricted   Mobility: Walking and Moving Around Discharge Status [phone]) At least 1 percent but less than 20 percent impaired, limited or restricted       Problem List Patient Active Problem List   Diagnosis Date Noted  . Fatigue 02/02/2015  . Constipation 01/20/2015  . Dizziness and giddiness 01/20/2015  . Multinodular goiter 06/23/2013  . Urinary hesitancy 01/22/2013  . Diverticulosis of colon without diverticulitis   . Menopausal sweats 02/25/2012  . Routine general medical examination at a health care facility 02/25/2012  . Osteopenia 08/01/2011  . GERD (gastroesophageal reflux disease)    Phillips Grout PT, DPT   Huprich,Jason 05/23/2015, 2:39 PM  [place] MAIN [facility] SERVICES [address] [place], [place], [address] Phone: [phone]   Fax:  [phone]

## 2015-06-21 HISTORY — PX: TOE SURGERY: SHX1073

## 2015-06-29 ENCOUNTER — Encounter: Payer: Self-pay | Admitting: Sports Medicine

## 2015-06-29 ENCOUNTER — Ambulatory Visit (INDEPENDENT_AMBULATORY_CARE_PROVIDER_SITE_OTHER): Payer: PPO | Admitting: Sports Medicine

## 2015-06-29 VITALS — Ht 65.0 in | Wt 155.0 lb

## 2015-06-29 DIAGNOSIS — M79674 Pain in right toe(s): Secondary | ICD-10-CM | POA: Diagnosis not present

## 2015-06-29 DIAGNOSIS — L600 Ingrowing nail: Secondary | ICD-10-CM | POA: Diagnosis not present

## 2015-06-29 NOTE — Progress Notes (Deleted)
   Subjective:    Patient ID: Bridget [name], female    DOB: [birth year], 81 y.o.   MRN: [id number]  HPI    Review of Systems  Musculoskeletal: Positive for back pain.  Neurological: Positive for dizziness and headaches.  All other systems reviewed and are negative.      Objective:   Physical Exam        Assessment & Plan:

## 2015-06-29 NOTE — Progress Notes (Signed)
Patient ID: Bridget [name], female   DOB: 1930-09-12, 81 y.o.   MRN: [id number] Subjective: Bridget [name] is a 81 y.o.  female patient presents to office today complaining of a painful incurvated, red, hot, swollen lateral nail border of the 1st toe on the right foot. This has been present for a few months. Patient has treated this by soaking. Admits to past history of ingrown nails and procedure to remove ingrown over 20 years ago.Patient denies fever/chills/nausea/vomitting/any other related constitutional symptoms at this time.  Review of Systems  Musculoskeletal: Positive for back pain.  Neurological: Positive for dizziness and headaches.  All other systems reviewed and are negative.  Patient Active Problem List   Diagnosis Date Noted  . Fatigue 02/02/2015  . Constipation 01/20/2015  . Dizziness and giddiness 01/20/2015  . Multinodular goiter 06/23/2013  . Urinary hesitancy 01/22/2013  . Diverticulosis of colon without diverticulitis   . Menopausal sweats 02/25/2012  . Routine general medical examination at a health care facility 02/25/2012  . Osteopenia 08/01/2011  . GERD (gastroesophageal reflux disease)    Current Outpatient Prescriptions on File Prior to Visit  Medication Sig Dispense Refill  . Calcium Carbonate-Vitamin D (CALCIUM + D) 600-200 MG-UNIT TABS Take 1 tablet by mouth daily.      . Multiple Vitamins-Minerals (CENTRUM SILVER PO) Take 1 tablet by mouth daily.      . pantoprazole (PROTONIX) 40 MG tablet TAKE ONE TABLET BY MOUTH ONCE DAILY 30 tablet 3   No current facility-administered medications on file prior to visit.   Allergies  Allergen Reactions  . Sulfa Antibiotics Itching    Objective:  General: Well developed, nourished, in no acute distress, alert and oriented x3   Dermatology: Skin is warm, dry and supple bilateral. Right hallux nail appears to be  severely incurvated with hyperkeratosis formation at the distal aspects of  The lateral nail border.  (+) Mild erythema. (+) Edema. (-) serosanguous  drainage present. The remaining nails appear unremarkable at this time. There are no open sores, lesions or other signs of infection present.  Vascular: Dorsalis Pedis artery and Posterior Tibial artery pedal pulses are 1/4 bilateral with immedate capillary fill time. Scant pedal hair growth present. Mild trace edema and varicosities  lower extremities bilateal.   Neruologic: Grossly intact via light touch bilateral.  Musculoskeletal: Tenderness to palpation of the right hallux lateral nail fold. Muscular strength within normal limits in all groups bilateral.   Assesement and Plan: Problem List Items Addressed This Visit    None    Visit Diagnoses    Ingrown toenail, right    -  Primary    hallux lateral margin    Pain in toe of right foot          -Discussed treatment alternatives and plan of care; Explained permanent/temporary nail avulsion and post procedure course to patient. - After a verbal consent, injected 3 ml of a 50:50 mixture of 2% plain  lidocaine and 0.5% plain marcaine in a normal hallux block fashion. Next, a  betadine prep was performed. Anesthesia was tested and found to be appropriate.  The offending right hallux lateral nail border was then incised from the hyponychium to the epinychium. The offending nail border was removed and cleared from the field. The area was curretted for any remaining nail or spicules. Phenol application performed and the area was then flushed with alcohol and dressed with silvadene cream and a dry sterile dressing. -Patient was instructed to leave the dressing  intact for today and begin soaking  in a weak solution of betadine and water tomorrow. Patient was instructed to  soak for 15 minutes each day and apply neosporin and a gauze or bandaid dressing each day. -Patient was instructed to monitor the toe for signs of infection and return to office if toe becomes red, hot or swollen. -Patient is to  return in 1 week for follow up care or sooner if problems arise.  Landis Martins, DPM

## 2015-06-29 NOTE — Patient Instructions (Signed)

## 2015-07-04 ENCOUNTER — Ambulatory Visit: Payer: Self-pay | Admitting: Podiatry

## 2015-07-06 ENCOUNTER — Encounter: Payer: Self-pay | Admitting: Sports Medicine

## 2015-07-06 ENCOUNTER — Ambulatory Visit (INDEPENDENT_AMBULATORY_CARE_PROVIDER_SITE_OTHER): Payer: PPO | Admitting: Sports Medicine

## 2015-07-06 ENCOUNTER — Telehealth: Payer: Self-pay | Admitting: Internal Medicine

## 2015-07-06 DIAGNOSIS — L03031 Cellulitis of right toe: Secondary | ICD-10-CM

## 2015-07-06 DIAGNOSIS — L600 Ingrowing nail: Secondary | ICD-10-CM

## 2015-07-06 DIAGNOSIS — M79674 Pain in right toe(s): Secondary | ICD-10-CM

## 2015-07-06 DIAGNOSIS — L02611 Cutaneous abscess of right foot: Secondary | ICD-10-CM

## 2015-07-06 MED ORDER — CEPHALEXIN 500 MG PO CAPS: 500.0000 mg | ORAL_CAPSULE | Freq: Three times a day (TID) | ORAL | Status: DC

## 2015-07-06 NOTE — Progress Notes (Signed)
Patient ID: Bridget [name], female   DOB: Dec 16, 1930, 81 y.o.   MRN: [id number] Subjective: Bridget [name] is a 81 y.o.  female patient returns to office today for follow up evaluation after having Right Hallux lateral permanent nail avulsion performed on 06-29-15. Patient has been soaking using betadine and applying topical antibiotic covered with bandaid daily. Patient denies fever/chills/nausea/vomitting/any other related constitutional symptoms at this time.  Objective:  General: Well developed, nourished, in no acute distress, alert and oriented x3   Dermatology: Skin is warm, dry and supple bilateral. Right hallux lateral nail bed appears to be clean, dry, with moderate granular tissue and surrounding eschar/scab. (+) Erythema and warmth. (+) Edema. (+) serosanguous drainage present. The remaining nails appear unremarkable at this time. There are no other lesions or other signs of infection present.  Neurovascular status: Intact. No lower extremity swelling; No pain with calf compression bilateral.  Musculoskeletal: There is tenderness to palpation of the right hallux lateral nail fold. Muscular strength within normal limits bilateral.   Assesement and Plan: Problem List Items Addressed This Visit    None    Visit Diagnoses    Ingrown toenail, right    -  Primary    hallux lateral margin with surrounding erythema    Cellulitis and abscess of toe, right        Relevant Medications    cephALEXin (KEFLEX) 500 MG capsule    Pain in toe of right foot        Relevant Medications    cephALEXin (KEFLEX) 500 MG capsule      -Examined patient  -Cleansed right hallux lateral nail fold and gently scrubbed with peroxide and q-tip/curetted away nonviable tissue at site and applied triple antibiotic covered with guaze dressing.  -Discussed plan of care with patient. -Rx Keflex 500mg  tid for infection -Patient to now begin soaking in a weak solution of Epsom salt and warm water. Patient was  instructed to soak for 15-20 minutes each day until the toe appears normal and there is no drainage, redness, tenderness, or swelling at the procedure site, and apply neosporin and a gauze or bandaid dressing each day as needed. May leave open to air at night. -Educated patient on long term care after nail surgery. -Patient was instrcuted to monitor the toe for reoccurrence and signs of infection; Patient advised to return to office or ER if toe becomes red, hot or swollen. -Patient is to return 1-2 weeks for recheck or sooner if problems arise.  Landis Martins, DPM

## 2015-07-06 NOTE — Telephone Encounter (Signed)
Left msg to call office to schedule AWV/msn °

## 2015-07-19 ENCOUNTER — Other Ambulatory Visit: Payer: Self-pay | Admitting: Internal Medicine

## 2015-07-20 ENCOUNTER — Encounter: Payer: Self-pay | Admitting: Sports Medicine

## 2015-07-20 ENCOUNTER — Ambulatory Visit (INDEPENDENT_AMBULATORY_CARE_PROVIDER_SITE_OTHER): Payer: PPO | Admitting: Sports Medicine

## 2015-07-20 DIAGNOSIS — L02611 Cutaneous abscess of right foot: Secondary | ICD-10-CM

## 2015-07-20 DIAGNOSIS — L600 Ingrowing nail: Secondary | ICD-10-CM

## 2015-07-20 DIAGNOSIS — L03031 Cellulitis of right toe: Secondary | ICD-10-CM

## 2015-07-20 DIAGNOSIS — M79674 Pain in right toe(s): Secondary | ICD-10-CM

## 2015-07-20 NOTE — Patient Instructions (Signed)

## 2015-07-20 NOTE — Progress Notes (Signed)
Patient ID: Bridget [name], female   DOB: 1931-03-21, 81 y.o.   MRN: [id number]  Subjective: Bridget [name] is a 81 y.o.  female patient returns to office today for follow up evaluation after having Right Hallux lateral permanent nail avulsion performed on 06-29-15. Patient has been soaking using betadine and applying topical antibiotic covered with bandaid daily. Patient denies fever/chills/nausea/vomitting/any other related constitutional symptoms at this time.  Patient Active Problem List   Diagnosis Date Noted  . Fatigue 02/02/2015  . Constipation 01/20/2015  . Dizziness and giddiness 01/20/2015  . Multinodular goiter 06/23/2013  . Urinary hesitancy 01/22/2013  . Diverticulosis of colon without diverticulitis   . Menopausal sweats 02/25/2012  . Routine general medical examination at a health care facility 02/25/2012  . Osteopenia 08/01/2011  . GERD (gastroesophageal reflux disease)    Current Outpatient Prescriptions on File Prior to Visit  Medication Sig Dispense Refill  . Calcium Carbonate-Vitamin D (CALCIUM + D) 600-200 MG-UNIT TABS Take 1 tablet by mouth daily.      . Multiple Vitamins-Minerals (CENTRUM SILVER PO) Take 1 tablet by mouth daily.      . pantoprazole (PROTONIX) 40 MG tablet TAKE ONE TABLET BY MOUTH ONCE DAILY 30 tablet 0   No current facility-administered medications on file prior to visit.   Allergies  Allergen Reactions  . Sulfa Antibiotics Itching    Objective:  General: Well developed, nourished, in no acute distress, alert and oriented x3   Dermatology: Skin is warm, dry and supple bilateral. Right hallux lateral nail bed appears to be clean, dry, with mild granular tissue and surrounding eschar/scab. No Erythema and warmth. Decreased Edema. No serosanguous drainage present. The remaining nails appear unremarkable at this time. There are no other lesions or other signs of infection present.  Neurovascular status: Intact. No lower extremity swelling; No  pain with calf compression bilateral.  Musculoskeletal: There is decreased tenderness to palpation of the right hallux lateral nail fold. Muscular strength within normal limits bilateral.   Assesement and Plan: Problem List Items Addressed This Visit    None    Visit Diagnoses    Ingrown toenail, right    -  Primary    hallux lateral margin, improving in nature    Cellulitis and abscess of toe, right        resolved    Pain in toe of right foot        improved      -Examined patient  -Cleansed right hallux lateral nail fold and gently scrubbed with peroxide and q-tip/curetted away nonviable tissue at site and applied triple antibiotic covered with guaze dressing.  -Discussed plan of care with patient. -Patient to begin soaking in a weak solution of Epsom salt and warm water. Patient was instructed to soak for 15-20 minutes each day until the toe appears normal and there is no drainage, redness, tenderness, or swelling at the procedure site, and apply neosporin and a gauze or bandaid dressing each day as needed. May leave open to air at night. -Educated patient on long term care after nail surgery. -Patient was instrcuted to monitor the toe for reoccurrence and signs of infection; Patient advised to return to office or ER if toe becomes red, hot or swollen. -Patient is to return as needed or sooner if problems arise.  Landis Martins, DPM

## 2015-07-25 ENCOUNTER — Ambulatory Visit (INDEPENDENT_AMBULATORY_CARE_PROVIDER_SITE_OTHER): Payer: PPO

## 2015-07-25 VITALS — BP 118/70 | HR 74 | Temp 98.2°F | Resp 14 | Ht 64.75 in | Wt 157.8 lb

## 2015-07-25 DIAGNOSIS — Z0000 Encounter for general adult medical examination without abnormal findings: Secondary | ICD-10-CM

## 2015-07-25 NOTE — Progress Notes (Signed)
Subjective:   Bridget [name] is a 82 y.o. female who presents for Medicare Annual (Subsequent) preventive examination.  Review of Systems:  No ROS.  Medicare Wellness Visit.  Cardiac Risk Factors include: advanced age (>43men, >66 women)     Objective:     Vitals: BP 118/70 mmHg  Pulse 74  Temp(Src) 98.2 F (36.8 C) (Oral)  Resp 14  Ht 5' 4.75" (1.645 m)  Wt 157 lb 12.8 oz (71.578 kg)  BMI 26.45 kg/m2  SpO2 95%  LMP   Tobacco History  Smoking status  . Never Smoker   Smokeless tobacco  . Never Used     Counseling given: Not Answered   Past Medical History  Diagnosis Date  . GERD (gastroesophageal reflux disease)   . Diverticulosis of colon without diverticulitis    Past Surgical History  Procedure Laterality Date  . Oophorectomy      Bilateral  . Abdominal hysterectomy      Not due to cancer, due to fibroids, BSO  . Toe surgery  06/2015    R Big toe Ingrown toenail removed     Family History  Problem Relation Age of Onset  . Cancer Brother     prostate  . Cancer Sister     colon  . Cancer Sister     lung  . Cancer Sister     bone  . Cancer Brother     lung (smoker)  . Heart failure Mother     Massive MI  . Heart failure Mother    History  Sexual Activity  . Sexual Activity: Not Currently    Outpatient Encounter Prescriptions as of 07/25/2015  Medication Sig  . Calcium Carbonate-Vitamin D (CALCIUM + D) 600-200 MG-UNIT TABS Take 1 tablet by mouth daily.    . Multiple Vitamins-Minerals (CENTRUM SILVER PO) Take 1 tablet by mouth daily.    . pantoprazole (PROTONIX) 40 MG tablet TAKE ONE TABLET BY MOUTH ONCE DAILY   No facility-administered encounter medications on file as of 07/25/2015.    Activities of Daily Living In your present state of health, do you have any difficulty performing the following activities: 07/25/2015  Hearing? N  Vision? N  Difficulty concentrating or making decisions? N  Walking or climbing stairs? Y  Dressing or  bathing? N  Doing errands, shopping? N  Preparing Food and eating ? N  Using the Toilet? N  In the past six months, have you accidently leaked urine? N  Do you have problems with loss of bowel control? N  Managing your Medications? N  Managing your Finances? N  Housekeeping or managing your Housekeeping? N    Patient Care Team: Crecencio Mc, MD as PCP - General (Internal Medicine)    Assessment:   This is a routine wellness examination for Bridget [name]. The goal of the wellness visit is to assist the patient how to close the gaps in care and create a preventative care plan for the patient.   Taking Calcium-VIT D as appropriate/Osteoporosis risk reviewed.  Medications reviewed; taking without issues or barriers.   Safety issues reviewed; smoke detectors in the home. No firearms in the home. Wears seatbelts when driving or riding with others. No violence in the home.  No identified risk were noted; The patient was oriented x 3; appropriate in dress and manner and no objective failures at ADL's or IADL's.   Influenza vaccine, declined, per patient request.  TDAP and ZOSTAVAX vaccine postponed, per patient request. Follow up with  insurance and PCP. Education provided.   Prevnar 13 vaccine postponed per patient request.  Follow up with PCP when feeling better.  Patient Concerns:  None at this time.  Follow up with PCP as needed.    Exercise Activities and Dietary recommendations Current Exercise Habits:: The patient does not participate in regular exercise at present  Goals    . Increase physical activity     Start walking again around the neighborhood at least 2 times a week.   Start attending [facility] program.      Fall Risk Fall Risk  07/25/2015 01/30/2015  Falls in the past year? No No   Depression Screen PHQ 2/9 Scores 07/25/2015 01/30/2015  PHQ - 2 Score 0 0     Cognitive Testing MMSE - Mini Mental State Exam 07/25/2015  Orientation to time 5    Orientation to Place 5  Registration 3  Attention/ Calculation 5  Recall 3  Language- name 2 objects 2  Language- repeat 1  Language- follow 3 step command 3  Language- read & follow direction 1  Write a sentence 1  Copy design 1  Total score 30    Immunization History  Administered Date(s) Administered  . Pneumococcal Polysaccharide-23 02/25/2012   Screening Tests Health Maintenance  Topic Date Due  . TETANUS/TDAP  12/01/1951  . ZOSTAVAX  11/30/1992  . PNA vac Low Risk Adult (2 of 2 - PCV13) 02/24/2013  . INFLUENZA VACCINE  10/19/2015 (Originally 02/19/2015)  . DEXA SCAN  Completed      Plan:   End of life planning; Advance aging; Advanced directives discussed. Copy requested of current HCPOA/Living Will.   During the course of the visit the patient was educated and counseled about the following appropriate screening and preventive services:   Vaccines to include Pneumoccal, Influenza, Hepatitis B, Td, Zostavax, HCV  Electrocardiogram  Cardiovascular Disease  Colorectal cancer screening  Bone density screening  Diabetes screening  Glaucoma screening  Mammography/PAP  Nutrition counseling   Patient Instructions (the written plan) was given to the patient.   Varney Biles, LPN  [id number]

## 2015-07-25 NOTE — Patient Instructions (Addendum)
Bridget [name],  Thank you for taking time to come for your Medicare Wellness Visit.  I appreciate your ongoing commitment to your health goals. Please review the following plan we discussed and let me know if I can assist you in the future.  Follow up with PCP as needed.  Happy New Year!   Health Maintenance, Female Adopting a healthy lifestyle and getting preventive care can go a long way to promote health and wellness. Talk with your health care provider about what schedule of regular examinations is right for you. This is a good chance for you to check in with your provider about disease prevention and staying healthy. In between checkups, there are plenty of things you can do on your own. Experts have done a lot of research about which lifestyle changes and preventive measures are most likely to keep you healthy. Ask your health care provider for more information. WEIGHT AND DIET  Eat a healthy diet  Be sure to include plenty of vegetables, fruits, low-fat dairy products, and lean protein.  Do not eat a lot of foods high in solid fats, added sugars, or salt.  Get regular exercise. This is one of the most important things you can do for your health.  Most adults should exercise for at least 150 minutes each week. The exercise should increase your heart rate and make you sweat (moderate-intensity exercise).  Most adults should also do strengthening exercises at least twice a week. This is in addition to the moderate-intensity exercise.  Maintain a healthy weight  Body mass index (BMI) is a measurement that can be used to identify possible weight problems. It estimates body fat based on height and weight. Your health care provider can help determine your BMI and help you achieve or maintain a healthy weight.  For females 63 years of age and older:   A BMI below 18.5 is considered underweight.  A BMI of 18.5 to 24.9 is normal.  A BMI of 25 to 29.9 is considered overweight.  A BMI  of 30 and above is considered obese.  Watch levels of cholesterol and blood lipids  You should start having your blood tested for lipids and cholesterol at 80 years of age, then have this test every 5 years.  You may need to have your cholesterol levels checked more often if:  Your lipid or cholesterol levels are high.  You are older than 80 years of age.  You are at high risk for heart disease.  CANCER SCREENING   Lung Cancer  Lung cancer screening is recommended for adults 3-58 years old who are at high risk for lung cancer because of a history of smoking.  A yearly low-dose CT scan of the lungs is recommended for people who:  Currently smoke.  Have quit within the past 15 years.  Have at least a 30-pack-year history of smoking. A pack year is smoking an average of one pack of cigarettes a day for 1 year.  Yearly screening should continue until it has been 15 years since you quit.  Yearly screening should stop if you develop a health problem that would prevent you from having lung cancer treatment.  Breast Cancer  Practice breast self-awareness. This means understanding how your breasts normally appear and feel.  It also means doing regular breast self-exams. Let your health care provider know about any changes, no matter how small.  If you are in your 20s or 30s, you should have a clinical breast exam (CBE) by  a health care provider every 1-3 years as part of a regular health exam.  If you are 3 or older, have a CBE every year. Also consider having a breast X-ray (mammogram) every year.  If you have a family history of breast cancer, talk to your health care provider about genetic screening.  If you are at high risk for breast cancer, talk to your health care provider about having an MRI and a mammogram every year.  Breast cancer gene (BRCA) assessment is recommended for women who have family members with BRCA-related cancers. BRCA-related cancers  include:  Breast.  Ovarian.  Tubal.  Peritoneal cancers.  Results of the assessment will determine the need for genetic counseling and BRCA1 and BRCA2 testing. Cervical Cancer Your health care provider may recommend that you be screened regularly for cancer of the pelvic organs (ovaries, uterus, and vagina). This screening involves a pelvic examination, including checking for microscopic changes to the surface of your cervix (Pap test). You may be encouraged to have this screening done every 3 years, beginning at age 87.  For women ages 10-65, health care providers may recommend pelvic exams and Pap testing every 3 years, or they may recommend the Pap and pelvic exam, combined with testing for human papilloma virus (HPV), every 5 years. Some types of HPV increase your risk of cervical cancer. Testing for HPV may also be done on women of any age with unclear Pap test results.  Other health care providers may not recommend any screening for nonpregnant women who are considered low risk for pelvic cancer and who do not have symptoms. Ask your health care provider if a screening pelvic exam is right for you.  If you have had past treatment for cervical cancer or a condition that could lead to cancer, you need Pap tests and screening for cancer for at least 20 years after your treatment. If Pap tests have been discontinued, your risk factors (such as having a new sexual partner) need to be reassessed to determine if screening should resume. Some women have medical problems that increase the chance of getting cervical cancer. In these cases, your health care provider may recommend more frequent screening and Pap tests. Colorectal Cancer  This type of cancer can be detected and often prevented.  Routine colorectal cancer screening usually begins at 80 years of age and continues through 80 years of age.  Your health care provider may recommend screening at an earlier age if you have risk factors for  colon cancer.  Your health care provider may also recommend using home test kits to check for hidden blood in the stool.  A small camera at the end of a tube can be used to examine your colon directly (sigmoidoscopy or colonoscopy). This is done to check for the earliest forms of colorectal cancer.  Routine screening usually begins at age 61.  Direct examination of the colon should be repeated every 5-10 years through 80 years of age. However, you may need to be screened more often if early forms of precancerous polyps or small growths are found. Skin Cancer  Check your skin from head to toe regularly.  Tell your health care provider about any new moles or changes in moles, especially if there is a change in a mole's shape or color.  Also tell your health care provider if you have a mole that is larger than the size of a pencil eraser.  Always use sunscreen. Apply sunscreen liberally and repeatedly throughout the day.  Protect yourself by wearing long sleeves, pants, a wide-brimmed hat, and sunglasses whenever you are outside. HEART DISEASE, DIABETES, AND HIGH BLOOD PRESSURE   High blood pressure causes heart disease and increases the risk of stroke. High blood pressure is more likely to develop in:  People who have blood pressure in the high end of the normal range (130-139/85-89 mm Hg).  People who are overweight or obese.  People who are African American.  If you are 18-39 years of age, have your blood pressure checked every 3-5 years. If you are 40 years of age or older, have your blood pressure checked every year. You should have your blood pressure measured twice--once when you are at a hospital or clinic, and once when you are not at a hospital or clinic. Record the average of the two measurements. To check your blood pressure when you are not at a hospital or clinic, you can use:  An automated blood pressure machine at a pharmacy.  A home blood pressure monitor.  If you  are between 55 years and 79 years old, ask your health care provider if you should take aspirin to prevent strokes.  Have regular diabetes screenings. This involves taking a blood sample to check your fasting blood sugar level.  If you are at a normal weight and have a low risk for diabetes, have this test once every three years after 80 years of age.  If you are overweight and have a high risk for diabetes, consider being tested at a younger age or more often. PREVENTING INFECTION  Hepatitis B  If you have a higher risk for hepatitis B, you should be screened for this virus. You are considered at high risk for hepatitis B if:  You were born in a country where hepatitis B is common. Ask your health care provider which countries are considered high risk.  Your parents were born in a high-risk country, and you have not been immunized against hepatitis B (hepatitis B vaccine).  You have HIV or AIDS.  You use needles to inject street drugs.  You live with someone who has hepatitis B.  You have had sex with someone who has hepatitis B.  You get hemodialysis treatment.  You take certain medicines for conditions, including cancer, organ transplantation, and autoimmune conditions. Hepatitis C  Blood testing is recommended for:  Everyone born from 1945 through 1965.  Anyone with known risk factors for hepatitis C. Sexually transmitted infections (STIs)  You should be screened for sexually transmitted infections (STIs) including gonorrhea and chlamydia if:  You are sexually active and are younger than 80 years of age.  You are older than 80 years of age and your health care provider tells you that you are at risk for this type of infection.  Your sexual activity has changed since you were last screened and you are at an increased risk for chlamydia or gonorrhea. Ask your health care provider if you are at risk.  If you do not have HIV, but are at risk, it may be recommended that you  take a prescription medicine daily to prevent HIV infection. This is called pre-exposure prophylaxis (PrEP). You are considered at risk if:  You are sexually active and do not regularly use condoms or know the HIV status of your partner(s).  You take drugs by injection.  You are sexually active with a partner who has HIV. Talk with your health care provider about whether you are at high risk of being infected   with HIV. If you choose to begin PrEP, you should first be tested for HIV. You should then be tested every 3 months for as long as you are taking PrEP.  PREGNANCY   If you are premenopausal and you may become pregnant, ask your health care provider about preconception counseling.  If you may become pregnant, take 400 to 800 micrograms (mcg) of folic acid every day.  If you want to prevent pregnancy, talk to your health care provider about birth control (contraception). OSTEOPOROSIS AND MENOPAUSE   Osteoporosis is a disease in which the bones lose minerals and strength with aging. This can result in serious bone fractures. Your risk for osteoporosis can be identified using a bone density scan.  If you are 28 years of age or older, or if you are at risk for osteoporosis and fractures, ask your health care provider if you should be screened.  Ask your health care provider whether you should take a calcium or vitamin D supplement to lower your risk for osteoporosis.  Menopause may have certain physical symptoms and risks.  Hormone replacement therapy may reduce some of these symptoms and risks. Talk to your health care provider about whether hormone replacement therapy is right for you.  HOME CARE INSTRUCTIONS   Schedule regular health, dental, and eye exams.  Stay current with your immunizations.   Do not use any tobacco products including cigarettes, chewing tobacco, or electronic cigarettes.  If you are pregnant, do not drink alcohol.  If you are breastfeeding, limit how  much and how often you drink alcohol.  Limit alcohol intake to no more than 1 drink per day for nonpregnant women. One drink equals 12 ounces of beer, 5 ounces of wine, or 1 ounces of hard liquor.  Do not use street drugs.  Do not share needles.  Ask your health care provider for help if you need support or information about quitting drugs.  Tell your health care provider if you often feel depressed.  Tell your health care provider if you have ever been abused or do not feel safe at home.   This information is not intended to replace advice given to you by your health care provider. Make sure you discuss any questions you have with your health care provider.   Document Released: 01/20/2011 Document Revised: 07/28/2014 Document Reviewed: 06/08/2013 Elsevier Interactive Patient Education [employer].

## 2015-07-25 NOTE — Progress Notes (Signed)
  I have reviewed the above information and agree with above.   Koal Eslinger, MD 

## 2015-08-17 ENCOUNTER — Other Ambulatory Visit: Payer: Self-pay | Admitting: Internal Medicine

## 2015-09-19 ENCOUNTER — Other Ambulatory Visit: Payer: Self-pay | Admitting: Internal Medicine

## 2015-10-12 ENCOUNTER — Ambulatory Visit (INDEPENDENT_AMBULATORY_CARE_PROVIDER_SITE_OTHER): Payer: PPO | Admitting: Internal Medicine

## 2015-10-12 ENCOUNTER — Encounter: Payer: Self-pay | Admitting: Internal Medicine

## 2015-10-12 VITALS — BP 124/76 | HR 58 | Temp 97.9°F | Resp 12 | Ht 64.5 in | Wt 158.8 lb

## 2015-10-12 DIAGNOSIS — E042 Nontoxic multinodular goiter: Secondary | ICD-10-CM

## 2015-10-12 DIAGNOSIS — R635 Abnormal weight gain: Secondary | ICD-10-CM

## 2015-10-12 DIAGNOSIS — E559 Vitamin D deficiency, unspecified: Secondary | ICD-10-CM | POA: Diagnosis not present

## 2015-10-12 DIAGNOSIS — Z7289 Other problems related to lifestyle: Secondary | ICD-10-CM | POA: Diagnosis not present

## 2015-10-12 DIAGNOSIS — R600 Localized edema: Secondary | ICD-10-CM

## 2015-10-12 DIAGNOSIS — Z23 Encounter for immunization: Secondary | ICD-10-CM

## 2015-10-12 DIAGNOSIS — E785 Hyperlipidemia, unspecified: Secondary | ICD-10-CM

## 2015-10-12 DIAGNOSIS — R601 Generalized edema: Secondary | ICD-10-CM

## 2015-10-12 DIAGNOSIS — R5383 Other fatigue: Secondary | ICD-10-CM | POA: Diagnosis not present

## 2015-10-12 DIAGNOSIS — Z0000 Encounter for general adult medical examination without abnormal findings: Secondary | ICD-10-CM

## 2015-10-12 DIAGNOSIS — Z1239 Encounter for other screening for malignant neoplasm of breast: Secondary | ICD-10-CM

## 2015-10-12 LAB — CBC WITH DIFFERENTIAL/PLATELET
Basophils Absolute: 0 10*3/uL (ref 0.0–0.1)
Basophils Relative: 0.5 % (ref 0.0–3.0)
EOS PCT: 2.3 % (ref 0.0–5.0)
Eosinophils Absolute: 0.1 10*3/uL (ref 0.0–0.7)
HEMATOCRIT: 39.2 % (ref 36.0–46.0)
Hemoglobin: 13.2 g/dL (ref 12.0–15.0)
Lymphocytes Relative: 31.1 % (ref 12.0–46.0)
Lymphs Abs: 1.9 10*3/uL (ref 0.7–4.0)
MCHC: 33.7 g/dL (ref 30.0–36.0)
MCV: 89.8 fl (ref 78.0–100.0)
Monocytes Absolute: 0.4 10*3/uL (ref 0.1–1.0)
Monocytes Relative: 7 % (ref 3.0–12.0)
Neutro Abs: 3.7 10*3/uL (ref 1.4–7.7)
Neutrophils Relative %: 59.1 % (ref 43.0–77.0)
Platelets: 348 10*3/uL (ref 150.0–400.0)
RBC: 4.36 Mil/uL (ref 3.87–5.11)
RDW: 13.3 % (ref 11.5–15.5)
WBC: 6.3 10*3/uL (ref 4.0–10.5)

## 2015-10-12 LAB — LIPID PANEL
CHOL/HDL RATIO: 3
Cholesterol: 161 mg/dL (ref 0–200)
HDL: 55.6 mg/dL (ref >39.00)
LDL Cholesterol: 96 mg/dL (ref 0–99)
NONHDL: 105.21
Triglycerides: 46 mg/dL (ref 0.0–149.0)
VLDL: 9.2 mg/dL (ref 0.0–40.0)

## 2015-10-12 LAB — COMPREHENSIVE METABOLIC PANEL
ALBUMIN: 4.4 g/dL (ref 3.5–5.2)
ALK PHOS: 51 U/L (ref 39–117)
ALT: 16 U/L (ref 0–35)
AST: 22 U/L (ref 0–37)
BUN: 11 mg/dL (ref 6–23)
CO2: 29 mEq/L (ref 19–32)
Calcium: 10.4 mg/dL (ref 8.4–10.5)
Chloride: 105 mEq/L (ref 96–112)
Creatinine, Ser: 0.77 mg/dL (ref 0.40–1.20)
GFR: 92.1 mL/min (ref >60.00)
Glucose, Bld: 106 mg/dL — ABNORMAL HIGH (ref 70–99)
Potassium: 3.9 mEq/L (ref 3.5–5.1)
Sodium: 141 mEq/L (ref 135–145)
TOTAL PROTEIN: 7.2 g/dL (ref 6.0–8.3)
Total Bilirubin: 0.8 mg/dL (ref 0.2–1.2)

## 2015-10-12 LAB — MICROALBUMIN / CREATININE URINE RATIO
Creatinine,U: 200.7 mg/dL
MICROALB UR: 1.2 mg/dL (ref 0.0–1.9)
Microalb Creat Ratio: 0.6 mg/g (ref 0.0–30.0)

## 2015-10-12 LAB — HEPATITIS C ANTIBODY: HCV Ab: NEGATIVE

## 2015-10-12 LAB — VITAMIN D 25 HYDROXY (VIT D DEFICIENCY, FRACTURES): VITD: 36.19 ng/mL (ref 30.00–100.00)

## 2015-10-12 LAB — HIV ANTIBODY (ROUTINE TESTING W REFLEX): HIV: NONREACTIVE

## 2015-10-12 LAB — TSH: TSH: 0.83 u[IU]/mL (ref 0.35–4.50)

## 2015-10-12 MED ORDER — TETANUS-DIPHTH-ACELL PERTUSSIS 5-2.5-18.5 LF-MCG/0.5 IM SUSP: 0.5000 mL | Freq: Once | INTRAMUSCULAR | Status: DC

## 2015-10-12 NOTE — Patient Instructions (Signed)

## 2015-10-12 NOTE — Progress Notes (Signed)
Pre-visit discussion using our clinic review tool. No additional management support is needed unless otherwise documented below in the visit note.  

## 2015-10-12 NOTE — Progress Notes (Signed)
Patient ID: Bridget [name], female    DOB: 11-25-1930  Age: 82 y.o. MRN: [id number]  The patient is here for annual Medicare wellness examination and management of other chronic and acute problems.   The risk factors are reflected in the social history.  The roster of all physicians providing medical care to patient - is listed in the Snapshot section of the chart.  Activities of daily living:  The patient is 100% independent in all ADLs: dressing, toileting, feeding as well as independent mobility  Home safety : The patient has smoke detectors in the home. They wear seatbelts.  There are no firearms at home. There is no violence in the home.   There is no risks for hepatitis, STDs or HIV. There is no   history of blood transfusion. They have no travel history to infectious disease endemic areas of the world.  The patient has seen their dentist in the last six month. They have seen their eye doctor in the last year. They admit to slight hearing difficulty with regard to whispered voices and some television programs.  They have deferred audiologic testing in the last year.  They do not  have excessive sun exposure. Discussed the need for sun protection: hats, long sleeves and use of sunscreen if there is significant sun exposure.   Diet: the importance of a healthy diet is discussed. They do have a healthy diet.  The benefits of regular aerobic exercise were discussed. She walks 4 times per week ,  20 minutes.   Depression screen: there are no signs or vegative symptoms of depression- irritability, change in appetite, anhedonia, sadness/tearfullness.  Cognitive assessment: the patient manages all their financial and personal affairs and is actively engaged. They could relate day,date,year and events; recalled 2/3 objects at 3 minutes; performed clock-face test normally.  The following portions of the patient's history were reviewed and updated as appropriate: allergies, current medications,  past family history, past medical history,  past surgical history, past social history  and problem list.  Visual acuity was not assessed per patient preference since she has regular follow up with her ophthalmologist. Hearing and body mass index were assessed and reviewed.   During the course of the visit the patient was educated and counseled about appropriate screening and preventive services including : fall prevention , diabetes screening, nutrition counseling, colorectal cancer screening, and recommended immunizations.    CC: The primary encounter diagnosis was Hyperlipidemia. Diagnoses of Weight gain, Other fatigue, Localized edema, Vitamin D deficiency, Other problems related to lifestyle, Breast cancer screening, Need for prophylactic vaccination against Streptococcus pneumoniae (pneumococcus), Routine general medical examination at a health care facility, Multinodular goiter, and Generalized edema were also pertinent to this visit.  Has a painful nodule on palmar surface of right hand. Has been present for the past year but has recentl y enlarged , located at base of thumb  Right handed . Marland Kitchen  Hurts to grip anything   Bilateral 2+ edema , noticed by podiatry  Wants to lose 10-20  Lbs,  Not exercising or  Follow ing a calori oe carb restricted diet  History Bridget [name] has a past medical history of GERD (gastroesophageal reflux disease) and Diverticulosis of colon without diverticulitis.   She has past surgical history that includes Oophorectomy; Abdominal hysterectomy; and Toe Surgery (06/2015).   Her family history includes Cancer in her brother, brother, sister, sister, and sister; Heart failure in her mother and mother.She reports that she has never smoked. She has  never used smokeless tobacco. She reports that she does not drink alcohol or use illicit drugs.  Outpatient Prescriptions Prior to Visit  Medication Sig Dispense Refill  . Calcium Carbonate-Vitamin D (CALCIUM + D) 600-200  MG-UNIT TABS Take 1 tablet by mouth daily.      . Multiple Vitamins-Minerals (CENTRUM SILVER PO) Take 1 tablet by mouth daily.      . pantoprazole (PROTONIX) 40 MG tablet TAKE ONE TABLET BY MOUTH ONCE DAILY 30 tablet 3   No facility-administered medications prior to visit.    Review of Systems   Patient denies headache, fevers, malaise, unintentional weight loss, skin rash, eye pain, sinus congestion and sinus pain, sore throat, dysphagia,  hemoptysis , cough, dyspnea, wheezing, chest pain, palpitations, orthopnea, edema, abdominal pain, nausea, melena, diarrhea, constipation, flank pain, dysuria, hematuria, urinary  Frequency, nocturia, numbness, tingling, seizures,  Focal weakness, Loss of consciousness,  Tremor, insomnia, depression, anxiety, and suicidal ideation.     Objective:  BP 124/76 mmHg  Pulse 58  Temp(Src) 97.9 F (36.6 C) (Oral)  Resp 12  Ht 5' 4.5" (1.638 m)  Wt 158 lb 12 oz (72.009 kg)  BMI 26.84 kg/m2  SpO2 94%  Physical Exam   General appearance: alert, cooperative and appears stated age Head: Normocephalic, without obvious abnormality, atraumatic Eyes: conjunctivae/corneas clear. PERRL, EOM's intact. Fundi benign. Ears: normal TM's and external ear canals both ears Nose: Nares normal. Septum midline. Mucosa normal. No drainage or sinus tenderness. Throat: lips, mucosa, and tongue normal; teeth and gums normal Neck: no adenopathy, no carotid bruit, no JVD, supple, symmetrical, trachea midline and thyroid not enlarged, symmetric, no tenderness/mass/nodules Lungs: clear to auscultation bilaterally Breasts: normal appearance, no masses or tenderness Heart: regular rate and rhythm, S1, S2 normal, no murmur, click, rub or gallop Abdomen: soft, non-tender; bowel sounds normal; no masses,  no organomegaly Extremities: extremities normal, atraumatic, no cyanosis or edema Pulses: 2+ and symmetric Skin: Skin color, texture, turgor normal. No rashes or  lesions Neurologic: Alert and oriented X 3, normal strength and tone. Normal symmetric reflexes. Normal coordination and gait.     Assessment & Plan:   Problem List Items Addressed This Visit    Routine general medical examination at a health care facility    Annual Medicare wellness  exam was done as well as a comprehensive physical exam and management of acute and chronic conditions .  During the course of the visit the patient was educated and counseled about appropriate screening and preventive services including : fall prevention , diabetes screening, nutrition counseling, colorectal cancer screening, and recommended immunizations.  Printed recommendations for health maintenance screenings was given.   Lab Results  Component Value Date   CHOL 161 10/12/2015   HDL 55.60 10/12/2015   LDLCALC 96 10/12/2015   TRIG 46.0 10/12/2015   CHOLHDL 3 10/12/2015         Multinodular goiter    Thyroid function is WNL      RESOLVED: Fatigue   Relevant Orders   CBC with Differential/Platelet (Completed)   Weight gain    .I have addressed  BMI and recommended a low glycemic index diet utilizing smaller more frequent meals to increase metabolism.  I have also recommended that patient start exercising with a goal of 30 minutes of aerobic exercise a minimum of 5 days per week. Screening for lipid disorders, thyroid and diabetes to be done today.        Edema    1+ pitting .  Improved by  morning daily.. 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  Advised to start a walking program      Relevant Orders   Comprehensive metabolic panel (Completed)   TSH (Completed)   Microalbumin / creatinine urine ratio (Completed)    Other Visit Diagnoses    Hyperlipidemia    -  Primary    Relevant Orders    Lipid panel (Completed)    Vitamin D deficiency        Relevant Orders    VITAMIN D 25 Hydroxy (Vit-D Deficiency, Fractures) (Completed)    Other problems related to lifestyle        Relevant Orders     Hepatitis C antibody (Completed)    HIV antibody (Completed)    Breast cancer screening        Relevant Orders    MM DIGITAL SCREENING BILATERAL    Need for prophylactic vaccination against Streptococcus pneumoniae (pneumococcus)        Relevant Orders    Pneumococcal conjugate vaccine 13-valent (Completed)       I am having Bridget [name] start on Tdap. I am also having her maintain her Multiple Vitamins-Minerals (CENTRUM SILVER PO), Calcium Carbonate-Vitamin D, and pantoprazole.  Meds ordered this encounter  Medications  . Tdap (BOOSTRIX) 5-2.5-18.5 LF-MCG/0.5 injection    Sig: Inject 0.5 mLs into the muscle once.    Dispense:  0.5 mL    Refill:  0    There are no discontinued medications.  Follow-up: No Follow-up on file.   Crecencio Mc, MD

## 2015-10-14 DIAGNOSIS — R609 Edema, unspecified: Secondary | ICD-10-CM | POA: Insufficient documentation

## 2015-10-14 DIAGNOSIS — R635 Abnormal weight gain: Secondary | ICD-10-CM | POA: Insufficient documentation

## 2015-10-14 NOTE — Assessment & Plan Note (Signed)
1+ pitting .  Improved by morning daily.. 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  Advised to start a walking program

## 2015-10-14 NOTE — Assessment & Plan Note (Signed)
Annual Medicare wellness  exam was done as well as a comprehensive physical exam and management of acute and chronic conditions .  During the course of the visit the patient was educated and counseled about appropriate screening and preventive services including : fall prevention , diabetes screening, nutrition counseling, colorectal cancer screening, and recommended immunizations.  Printed recommendations for health maintenance screenings was given.   Lab Results  Component Value Date   CHOL 161 10/12/2015   HDL 55.60 10/12/2015   LDLCALC 96 10/12/2015   TRIG 46.0 10/12/2015   CHOLHDL 3 10/12/2015

## 2015-10-14 NOTE — Assessment & Plan Note (Signed)
I have addressed  BMI and recommended a low glycemic index diet utilizing smaller more frequent meals to increase metabolism.  I have also recommended that patient start exercising with a goal of 30 minutes of aerobic exercise a minimum of 5 days per week. Screening for lipid disorders, thyroid and diabetes to be done today.   

## 2015-10-14 NOTE — Assessment & Plan Note (Signed)
Thyroid function is WNL

## 2015-10-17 ENCOUNTER — Encounter: Payer: Self-pay | Admitting: *Deleted

## 2015-10-18 ENCOUNTER — Telehealth: Payer: Self-pay

## 2015-10-18 NOTE — Telephone Encounter (Signed)
Notified pt of test results, pt verbalized understanding and appreciation.

## 2015-10-25 ENCOUNTER — Telehealth: Payer: Self-pay | Admitting: Internal Medicine

## 2015-10-25 NOTE — Telephone Encounter (Signed)
Attempted to reach patient, left a VM to return my call  

## 2015-10-25 NOTE — Telephone Encounter (Signed)
Pt called about having a question regarding lab results? Call pt @ [phone]. Thank you!

## 2015-10-26 NOTE — Telephone Encounter (Signed)
Explained that CDC guidelines call for patient to be tested because she could or could not have been exposed to blood or blood products that were not tested in that time frame that could have been contaminated.

## 2015-10-26 NOTE — Telephone Encounter (Signed)
Spoke with the patient on the phone.  Patient requested to know why she had a HIV test done.  She was very upset that at 23 someone thinks as a married women she would need that.  I tried to explain that we have sets of orders that are completed based on patients age and co-morbiities.  She kept repeating that she isn't out on the streets and that she had no need for that test and that it was a violation of her.  I calmed her down and told her I would pass the information along.

## 2015-12-11 DIAGNOSIS — M549 Dorsalgia, unspecified: Secondary | ICD-10-CM | POA: Diagnosis not present

## 2015-12-11 DIAGNOSIS — R079 Chest pain, unspecified: Secondary | ICD-10-CM | POA: Diagnosis not present

## 2016-01-15 ENCOUNTER — Other Ambulatory Visit: Payer: Self-pay | Admitting: Internal Medicine

## 2016-05-13 ENCOUNTER — Other Ambulatory Visit: Payer: Self-pay | Admitting: Internal Medicine

## 2016-07-22 ENCOUNTER — Telehealth: Payer: Self-pay | Admitting: Internal Medicine

## 2016-07-22 NOTE — Telephone Encounter (Signed)
Pt declined getting the AWV done. Thank you!

## 2016-07-24 ENCOUNTER — Ambulatory Visit: Payer: PPO

## 2016-08-12 ENCOUNTER — Telehealth: Payer: Self-pay | Admitting: Internal Medicine

## 2016-08-12 NOTE — Telephone Encounter (Signed)
[facility] called needing to have pt medication pantoprazole (PROTONIX) 40 MG tablet refilled.   Pharmacy [facility] [phone]. Thank you!

## 2016-08-14 MED ORDER — PANTOPRAZOLE SODIUM 40 MG PO TBEC: 40.0000 mg | DELAYED_RELEASE_TABLET | Freq: Every day | ORAL | 2 refills | Status: DC

## 2016-08-14 NOTE — Telephone Encounter (Signed)
Rx sent to pharmacy   

## 2016-08-23 DIAGNOSIS — J0110 Acute frontal sinusitis, unspecified: Secondary | ICD-10-CM | POA: Diagnosis not present

## 2016-08-23 DIAGNOSIS — R51 Headache: Secondary | ICD-10-CM | POA: Diagnosis not present

## 2016-11-10 ENCOUNTER — Other Ambulatory Visit: Payer: Self-pay | Admitting: Internal Medicine

## 2016-11-13 ENCOUNTER — Telehealth: Payer: Self-pay

## 2016-11-13 ENCOUNTER — Other Ambulatory Visit: Payer: Self-pay | Admitting: Internal Medicine

## 2016-11-13 NOTE — Telephone Encounter (Signed)
Left voice mail to call back for patient she needs to schedule appointment before any more refills on pantoprazole.   lov 10/12/15,  Sooke with pharmacist at [facility] who advised me patient was given 30 day supply and he will advise patient she needs to call our office to schedule appointment.

## 2016-12-31 DIAGNOSIS — J019 Acute sinusitis, unspecified: Secondary | ICD-10-CM | POA: Diagnosis not present

## 2016-12-31 DIAGNOSIS — B9689 Other specified bacterial agents as the cause of diseases classified elsewhere: Secondary | ICD-10-CM | POA: Diagnosis not present

## 2017-01-14 DIAGNOSIS — Z23 Encounter for immunization: Secondary | ICD-10-CM | POA: Diagnosis not present

## 2017-01-14 DIAGNOSIS — L03032 Cellulitis of left toe: Secondary | ICD-10-CM | POA: Diagnosis not present

## 2017-02-17 DIAGNOSIS — M25511 Pain in right shoulder: Secondary | ICD-10-CM | POA: Diagnosis not present

## 2017-02-26 ENCOUNTER — Encounter: Payer: Self-pay | Admitting: Medical Oncology

## 2017-02-26 ENCOUNTER — Emergency Department
Admission: EM | Admit: 2017-02-26 | Discharge: 2017-02-26 | Disposition: A | Discharge status: Home / Self Care | Payer: PPO | Attending: Emergency Medicine | Admitting: Emergency Medicine

## 2017-02-26 ENCOUNTER — Emergency Department: Payer: PPO

## 2017-02-26 DIAGNOSIS — M19011 Primary osteoarthritis, right shoulder: Secondary | ICD-10-CM | POA: Insufficient documentation

## 2017-02-26 DIAGNOSIS — Z79899 Other long term (current) drug therapy: Secondary | ICD-10-CM | POA: Diagnosis not present

## 2017-02-26 DIAGNOSIS — M25511 Pain in right shoulder: Secondary | ICD-10-CM

## 2017-02-26 MED ORDER — MELOXICAM 7.5 MG PO TABS: 7.5000 mg | ORAL_TABLET | Freq: Every day | ORAL | 0 refills | Status: DC

## 2017-02-26 NOTE — ED Provider Notes (Signed)
-----------------------------------------   11:18 AM on 02/26/2017 -----------------------------------------  ED ECG REPORT I, Camrynn Mcclintic, the attending physician, personally viewed and interpreted this ECG.  Date: 02/26/2017 EKG Time: 11:12 Rate: 63 Rhythm: normal sinus rhythm QRS Axis: LAD Intervals: normal, borderline LVH ST/T Wave abnormalities: Inverted T-wave in lead 3, otherwise unremarkable Narrative Interpretation: unremarkablewith no evidence of acute ischemic    Hinda Kehr, MD 02/26/17 1119

## 2017-02-26 NOTE — ED Provider Notes (Signed)
[facility] Emergency Department Provider Note   ____________________________________________   First MD Initiated Contact with Patient 02/26/17 1121     (approximate)  I have reviewed the triage vital signs and the nursing notes.   HISTORY  Chief Complaint Shoulder Pain    HPI Bridget [name] is a 83 y.o. female patient complaining of right shoulder pain times one week. No provocative incident for her complaint. Pain is at the superior aspect of the [facility] joint. Pain increases with abduction overhead reaching. Patient denies loss of sensation. Patient is right-hand dominant. Patient rates the pain as a 5/10. No palliative measures for complaint.   Past Medical History:  Diagnosis Date  . Diverticulosis of colon without diverticulitis   . GERD (gastroesophageal reflux disease)     Patient Active Problem List   Diagnosis Date Noted  . Weight gain 10/14/2015  . Edema 10/14/2015  . Constipation 01/20/2015  . Multinodular goiter 06/23/2013  . Urinary hesitancy 01/22/2013  . Diverticulosis of colon without diverticulitis   . Menopausal sweats 02/25/2012  . Routine general medical examination at a health care facility 02/25/2012  . Osteopenia 08/01/2011  . GERD (gastroesophageal reflux disease)     Past Surgical History:  Procedure Laterality Date  . ABDOMINAL HYSTERECTOMY     Not due to cancer, due to fibroids, BSO  . OOPHORECTOMY     Bilateral  . TOE SURGERY  06/2015   R Big toe Ingrown toenail removed      Prior to Admission medications   Medication Sig Start Date End Date Taking? Authorizing Provider  Calcium Carbonate-Vitamin D (CALCIUM + D) 600-200 MG-UNIT TABS Take 1 tablet by mouth daily.      [provider]  meloxicam (MOBIC) 7.5 MG tablet Take 1 tablet (7.5 mg total) by mouth daily. 02/26/17   Sable Feil, PA-C  Multiple Vitamins-Minerals (CENTRUM SILVER PO) Take 1 tablet by mouth daily.      [provider]    pantoprazole (PROTONIX) 40 MG tablet TAKE 1 TABLET BY MOUTH ONCE DAILY 11/13/16   Crecencio Mc, MD  Tdap Durwin Reges) 5-2.5-18.5 LF-MCG/0.5 injection Inject 0.5 mLs into the muscle once. 10/12/15   Crecencio Mc, MD    Allergies Sulfa antibiotics  Family History  Problem Relation Age of Onset  . Heart failure Mother        Massive MI  . Cancer Brother        prostate  . Cancer Sister        colon  . Cancer Sister        lung  . Cancer Sister        bone  . Cancer Brother        lung (smoker)    Social History Social History  Substance Use Topics  . Smoking status: Never Smoker  . Smokeless tobacco: Never Used  . Alcohol use No    Review of Systems  Constitutional: No fever/chills Eyes: No visual changes. ENT: No sore throat. Cardiovascular: Denies chest pain. Respiratory: Denies shortness of breath. Gastrointestinal: No abdominal pain.  No nausea, no vomiting.  No diarrhea.  No constipation. Genitourinary: Negative for dysuria. Musculoskeletal:Right shoulder  Skin: Negative for rash. Neurological: Negative for headaches, focal weakness or numbness. Allergic/Immunilogical: Sulfa antibiotics ____________________________________________   PHYSICAL EXAM:  VITAL SIGNS: ED Triage Vitals  Enc Vitals Group     BP 02/26/17 1053 (!) 166/77     Pulse Rate 02/26/17 1053 64  Resp 02/26/17 1053 12     Temp 02/26/17 1053 (!) 97.4 F (36.3 C)     Temp Source 02/26/17 1053 Oral     SpO2 02/26/17 1053 98 %     Weight 02/26/17 1053 155 lb (70.3 kg)     Height 02/26/17 1053 5\' 5"  (1.651 m)     Head Circumference --      Peak Flow --      Pain Score 02/26/17 1109 5     Pain Loc --      Pain Edu? --      Excl. in [place]? --     Constitutional: Alert and oriented. Well appearing and in no acute distress. Cardiovascular: Normal rate, regular rhythm. Grossly normal heart sounds.  Good peripheral circulation. Elevated blood pressure Respiratory: Normal respiratory  effort.  No retractions. Lungs CTAB. Musculoskeletal: No obvious deformity. No edema or erythema. No obvious edema. Moderate guarding palpation. Aspects of the [facility] joint. Decreased range of motion's with abduction overhead reaching and limited by complaining of pain.  Neurologic:  Normal speech and language. No gross focal neurologic deficits are appreciated. No gait instability. Skin:  Skin is warm, dry and intact. No rash noted. Psychiatric: Mood and affect are normal. Speech and behavior are normal.  ____________________________________________   LABS (all labs ordered are listed, but only abnormal results are displayed)  Labs Reviewed - No data to display ____________________________________________  EKG   ____________________________________________  RADIOLOGY  Dg Shoulder Right  Result Date: 02/26/2017 CLINICAL DATA:  Worsening right shoulder pain superiorly for 1 week. No reported injury. EXAM: RIGHT SHOULDER - 2+ VIEW COMPARISON:  None. FINDINGS: No fracture. No right glenohumeral joint dislocation. No evidence of right acromioclavicular joint separation. No suspicious focal osseous lesions. There is mild erosive change at the right acromioclavicular joint. No pathologic soft tissue calcifications or radiopaque foreign bodies. IMPRESSION: Mild erosive change at the right acromioclavicular joint. Differential includes rheumatoid arthritis, hyperparathyroidism or repetitive motion injury. No fracture or malalignment. Electronically Signed   By: Ilona Sorrel M.D.   On: 02/26/2017 12:37    ____________________________________________   PROCEDURES  Procedure(s) performed: None  Procedures  Critical Care performed: No  ____________________________________________   INITIAL IMPRESSION / ASSESSMENT AND PLAN / ED COURSE  Pertinent labs & imaging results that were available during my care of the patient were reviewed by me and considered in my medical decision making (see chart  for details).  Right shoulder pain consistent with arthritis. Discussed x-ray finding with patient. Patient given discharge care instructions. Patient advised follow-up with PCP for continual care.      ____________________________________________   FINAL CLINICAL IMPRESSION(S) / ED DIAGNOSES  Final diagnoses:  Primary osteoarthritis of right shoulder  Acute pain of right shoulder      NEW MEDICATIONS STARTED DURING THIS VISIT:  New Prescriptions   MELOXICAM (MOBIC) 7.5 MG TABLET    Take 1 tablet (7.5 mg total) by mouth daily.     Note:  This document was prepared using Dragon voice recognition software and may include unintentional dictation errors.    Sable Feil, PA-C 02/26/17 1248    Nance Pear, MD 02/26/17 1332

## 2017-02-26 NOTE — ED Triage Notes (Signed)
Pt reports rt shoulder aching x 1 week, no known injury.

## 2017-03-05 ENCOUNTER — Ambulatory Visit (INDEPENDENT_AMBULATORY_CARE_PROVIDER_SITE_OTHER): Payer: PPO

## 2017-03-05 ENCOUNTER — Ambulatory Visit (INDEPENDENT_AMBULATORY_CARE_PROVIDER_SITE_OTHER): Payer: PPO | Admitting: Family

## 2017-03-05 ENCOUNTER — Encounter: Payer: Self-pay | Admitting: Family

## 2017-03-05 VITALS — BP 130/82 | HR 74 | Temp 98.5°F | Ht 65.0 in | Wt 157.6 lb

## 2017-03-05 DIAGNOSIS — M549 Dorsalgia, unspecified: Secondary | ICD-10-CM

## 2017-03-05 DIAGNOSIS — M4184 Other forms of scoliosis, thoracic region: Secondary | ICD-10-CM | POA: Diagnosis not present

## 2017-03-05 MED ORDER — DICLOFENAC SODIUM 1 % TD GEL: 4.0000 g | Freq: Four times a day (QID) | TRANSDERMAL | 3 refills | Status: DC

## 2017-03-05 MED ORDER — LIDOCAINE 5 % EX PTCH: 1.0000 | MEDICATED_PATCH | CUTANEOUS | 0 refills | Status: DC

## 2017-03-05 NOTE — Patient Instructions (Signed)
Xray  Try lidocaine patch  You may also try voltaren gel for pain   Continue heat  If there is no improvement in your symptoms, or if there is any worsening of symptoms, or if you have any additional concerns, please return for re-evaluation; or, if we are closed, consider going to the Emergency Room for evaluation if symptoms urgent.

## 2017-03-05 NOTE — Progress Notes (Signed)
Subjective:    Patient ID: Bridget [name], female    DOB: 09/02/1930, 82 y.o.   MRN: [id number]  CC: Bridget [name] is a 83 y.o. female who presents today for follow up.   HPI: Right shoulder 'stabbing' pain, intermittent,  not better since ED. No pain today. Pain is not associated with meals. Started couple of weeks ago.  Hurts even when still. Able to complete ADLs.  In past, tylenol had helped. NO injury.  Tried heat with a little relief. Mobic not helping.         No h/o RA, hyperparathyroidism.  Reviewed lab history and calcium had been normal.   ED 8/9 for right shoulder pain;  Pain with overhead reaching. No fracture seen on XR. Mild erosive changes. Given mobic   HISTORY:  Past Medical History:  Diagnosis Date  . Diverticulosis of colon without diverticulitis   . GERD (gastroesophageal reflux disease)    Past Surgical History:  Procedure Laterality Date  . ABDOMINAL HYSTERECTOMY     Not due to cancer, due to fibroids, BSO  . OOPHORECTOMY     Bilateral  . TOE SURGERY  06/2015   R Big toe Ingrown toenail removed     Family History  Problem Relation Age of Onset  . Heart failure Mother        Massive MI  . Cancer Brother        prostate  . Cancer Sister        colon  . Cancer Sister        lung  . Cancer Sister        bone  . Cancer Brother        lung (smoker)    Allergies: Sulfa antibiotics Current Outpatient Prescriptions on File Prior to Visit  Medication Sig Dispense Refill  . Calcium Carbonate-Vitamin D (CALCIUM + D) 600-200 MG-UNIT TABS Take 1 tablet by mouth daily.      . meloxicam (MOBIC) 7.5 MG tablet Take 1 tablet (7.5 mg total) by mouth daily. 10 tablet 0  . Multiple Vitamins-Minerals (CENTRUM SILVER PO) Take 1 tablet by mouth daily.      . pantoprazole (PROTONIX) 40 MG tablet TAKE 1 TABLET BY MOUTH ONCE DAILY 30 tablet 3  . Tdap (BOOSTRIX) 5-2.5-18.5 LF-MCG/0.5 injection Inject 0.5 mLs into the muscle once. 0.5 mL 0   No current  facility-administered medications on file prior to visit.     Social History  Substance Use Topics  . Smoking status: Never Smoker  . Smokeless tobacco: Never Used  . Alcohol use No    Review of Systems  Constitutional: Negative for chills and fever.  Respiratory: Negative for cough.   Cardiovascular: Negative for chest pain and palpitations.  Gastrointestinal: Negative for nausea and vomiting.  Musculoskeletal: Positive for back pain. Negative for joint swelling and neck pain.  Neurological: Negative for weakness and numbness.      Objective:    BP 130/82   Pulse 74   Temp 98.5 F (36.9 C) (Oral)   Ht 5\' 5"  (1.651 m)   Wt 157 lb 9.6 oz (71.5 kg)   SpO2 94%   BMI 26.23 kg/m  BP Readings from Last 3 Encounters:  03/05/17 130/82  02/26/17 (!) 166/77  10/12/15 124/76   Wt Readings from Last 3 Encounters:  03/05/17 157 lb 9.6 oz (71.5 kg)  02/26/17 155 lb (70.3 kg)  10/12/15 158 lb 12 oz (72 kg)    Physical Exam  Constitutional:  She appears well-developed and well-nourished.  Eyes: Conjunctivae are normal.  Cardiovascular: Normal rate, regular rhythm, normal heart sounds and normal pulses.   Pulmonary/Chest: Effort normal and breath sounds normal. She has no wheezes. She has no rhonchi. She has no rales.  Musculoskeletal:       Right shoulder: Normal. She exhibits normal range of motion, no tenderness, no bony tenderness, no swelling, no deformity, no laceration, no pain and no spasm.       Cervical back: She exhibits pain. She exhibits normal range of motion, no tenderness, no bony tenderness and no spasm.       Back:  Pain is described by patient marked on diagram. negative Spurling's test.  Bilateral Shoulders:   No asymmetry of shoulders when comparing right and left.No pain with palpation over glenohumeral joint lines, [place] joint, AC joint, or bicipital groove. No pain with internal and external rotation. No pain with resisted lateral extension .   Negative  active painful arc sign. Negative passive arc ( Neer's). Negative drop arm.  No pain, swelling, or ecchymosis noted over long head of biceps.    Strength and sensation normal BUE's.   Neurological: She is alert.  Grip strength normal, sensation intact  Skin: Skin is warm and dry.  No rash  Psychiatric: She has a normal mood and affect. Her speech is normal and behavior is normal. Thought content normal.  Vitals reviewed.      Assessment & Plan:  1. Upper back pain Etiology of pain is not specific at this time. I am very reassured by patient's normal shoulder exam and range of motion. She had no limitations or pain during exam. She more points more to her pain being right upper back over the scapular region. Pain not associated with meals. Topical medications for pain given today.   Pending thoracic spine x-ray as well as referral to orthopedics for further management.   - DG Thoracic Spine W/Swimmers - lidocaine (LIDODERM) 5 %; Place 1 patch onto the skin daily. Remove & Discard patch within 12 hours.  Dispense: 30 patch; Refill: 0 - diclofenac sodium (VOLTAREN) 1 % GEL; Apply 4 g topically 4 (four) times daily.  Dispense: 1 Tube; Refill: 3 - Ambulatory referral to Orthopedic Surgery   I am having Ms. Wertheim maintain her Multiple Vitamins-Minerals (CENTRUM SILVER PO), Calcium Carbonate-Vitamin D, Tdap, pantoprazole, and meloxicam.   No orders of the defined types were placed in this encounter.   Return precautions given.   Risks, benefits, and alternatives of the medications and treatment plan prescribed today were discussed, and patient expressed understanding.   Education regarding symptom management and diagnosis given to patient on AVS.  Continue to follow with Crecencio Mc, MD for routine health maintenance.   Sallyanne Kizzie Furnish and I agreed with plan.   Mable Paris, FNP

## 2017-03-05 NOTE — Progress Notes (Signed)
Pre visit review using our clinic review tool, if applicable. No additional management support is needed unless otherwise documented below in the visit note. 

## 2017-03-10 ENCOUNTER — Telehealth: Payer: Self-pay | Admitting: Internal Medicine

## 2017-03-10 NOTE — Telephone Encounter (Signed)
Pt called requesting xray results. Please advise, thank you!  Call pt @ [phone]

## 2017-03-11 NOTE — Telephone Encounter (Signed)
Please advise, thanks.

## 2017-03-11 NOTE — Telephone Encounter (Signed)
Patient is aware of results and would like to talk to Dr. Derrel Nip before scheduling ortho appointment.

## 2017-03-11 NOTE — Telephone Encounter (Signed)
Call call with results, thanks

## 2017-03-11 NOTE — Telephone Encounter (Signed)
See under result note- I put a call out yesterday for her

## 2017-03-20 ENCOUNTER — Telehealth: Payer: Self-pay | Admitting: Internal Medicine

## 2017-03-20 NOTE — Telephone Encounter (Signed)
Left pt message asking to call Ebony Hail back directly at [phone] to schedule AWV. Thanks!  *NOTE* Last AWV 07/25/15  CPE is scheduled for 03/26/17-asked pt to make AWV a few days prior to include bloodwork for cpe

## 2017-03-26 DIAGNOSIS — M5412 Radiculopathy, cervical region: Secondary | ICD-10-CM | POA: Diagnosis not present

## 2017-03-27 ENCOUNTER — Ambulatory Visit (INDEPENDENT_AMBULATORY_CARE_PROVIDER_SITE_OTHER): Payer: PPO

## 2017-03-27 ENCOUNTER — Ambulatory Visit (INDEPENDENT_AMBULATORY_CARE_PROVIDER_SITE_OTHER): Payer: PPO | Admitting: Internal Medicine

## 2017-03-27 ENCOUNTER — Encounter: Payer: Self-pay | Admitting: Internal Medicine

## 2017-03-27 VITALS — BP 142/88 | HR 60 | Temp 98.1°F | Resp 15 | Ht 65.0 in | Wt 155.4 lb

## 2017-03-27 DIAGNOSIS — M25511 Pain in right shoulder: Secondary | ICD-10-CM | POA: Diagnosis not present

## 2017-03-27 DIAGNOSIS — R5383 Other fatigue: Secondary | ICD-10-CM | POA: Diagnosis not present

## 2017-03-27 DIAGNOSIS — I700 Atherosclerosis of aorta: Secondary | ICD-10-CM

## 2017-03-27 DIAGNOSIS — Z1239 Encounter for other screening for malignant neoplasm of breast: Secondary | ICD-10-CM

## 2017-03-27 DIAGNOSIS — E559 Vitamin D deficiency, unspecified: Secondary | ICD-10-CM | POA: Diagnosis not present

## 2017-03-27 DIAGNOSIS — K5900 Constipation, unspecified: Secondary | ICD-10-CM | POA: Diagnosis not present

## 2017-03-27 DIAGNOSIS — M1611 Unilateral primary osteoarthritis, right hip: Secondary | ICD-10-CM | POA: Diagnosis not present

## 2017-03-27 DIAGNOSIS — M25551 Pain in right hip: Secondary | ICD-10-CM | POA: Diagnosis not present

## 2017-03-27 DIAGNOSIS — Z1231 Encounter for screening mammogram for malignant neoplasm of breast: Secondary | ICD-10-CM

## 2017-03-27 LAB — COMPREHENSIVE METABOLIC PANEL
ALT: 13 U/L (ref 0–35)
AST: 20 U/L (ref 0–37)
Albumin: 4.5 g/dL (ref 3.5–5.2)
Alkaline Phosphatase: 53 U/L (ref 39–117)
BUN: 9 mg/dL (ref 6–23)
CALCIUM: 10.5 mg/dL (ref 8.4–10.5)
CO2: 30 mEq/L (ref 19–32)
CREATININE: 0.77 mg/dL (ref 0.40–1.20)
Chloride: 104 mEq/L (ref 96–112)
GFR: 91.78 mL/min (ref >60.00)
Glucose, Bld: 99 mg/dL (ref 70–99)
Potassium: 3.9 mEq/L (ref 3.5–5.1)
Sodium: 141 mEq/L (ref 135–145)
TOTAL PROTEIN: 7 g/dL (ref 6.0–8.3)
Total Bilirubin: 0.8 mg/dL (ref 0.2–1.2)

## 2017-03-27 LAB — LIPID PANEL
Cholesterol: 167 mg/dL (ref 0–200)
HDL: 47.3 mg/dL
LDL Cholesterol: 103 mg/dL — ABNORMAL HIGH (ref 0–99)
NonHDL: 119.83
Total CHOL/HDL Ratio: 4
Triglycerides: 84 mg/dL (ref 0.0–149.0)
VLDL: 16.8 mg/dL (ref 0.0–40.0)

## 2017-03-27 LAB — VITAMIN D 25 HYDROXY (VIT D DEFICIENCY, FRACTURES): VITD: 35.56 ng/mL (ref 30.00–100.00)

## 2017-03-27 LAB — TSH: TSH: 0.86 u[IU]/mL (ref 0.35–4.50)

## 2017-03-27 LAB — SEDIMENTATION RATE: SED RATE: 3 mm/h (ref 0–30)

## 2017-03-27 NOTE — Patient Instructions (Signed)
I have ordered your mammogram and your bone density tests  I have done an x ray of your right hip  If the prednisone helps the  Hip and the shoulder,  Let me know

## 2017-03-27 NOTE — Progress Notes (Signed)
Patient ID: Bridget [name], female    DOB: Dec 02, 1930  Age: 83 y.o. MRN: [id number]  The patient is here for  Follow up on  chronic and acute problems. Last seen by me March 2017   DEXA 2013: normal hip,  -1.1 forearm    Overdue for mammogram last one    The risk factors are reflected and reviewed in the social history.  The roster of all physicians providing medical care to patient - is listed in the Snapshot section of the chart.  Activities of daily living:  The patient is 100% independent in all ADLs: dressing, toileting, feeding as well as independent mobility  Home safety : The patient has smoke detectors in the home. They wear seatbelts.  There are no firearms at home. There is no violence in the home.   There is no risks for hepatitis, STDs or HIV. There is no   history of blood transfusion. They have no travel history to infectious disease endemic areas of the world.  The patient has seen their dentist in the last six month. They have seen their eye doctor in the last year. They admit to slight hearing difficulty with regard to whispered voices and some television programs.  They have deferred audiologic testing in the last year.  They do not  have excessive sun exposure. Discussed the need for sun protection: hats, long sleeves and use of sunscreen if there is significant sun exposure.   Diet: the importance of a healthy diet is discussed. They do have a healthy diet.  The benefits of regular aerobic exercise were discussed. She walks 4 times per week ,  20 minutes.   Depression screen: there are no signs or vegative symptoms of depression- irritability, change in appetite, anhedonia, sadness/tearfullness.  Cognitive assessment: the patient manages all their financial and personal affairs and is actively engaged. They could relate day,date,year and events; recalled 2/3 objects at 3 minutes; performed clock-face test normally.  The following portions of the patient's history  were reviewed and updated as appropriate: allergies, current medications, past family history, past medical history,  past surgical history, past social history  and problem list.  Visual acuity was not assessed per patient preference since she has regular follow up with her ophthalmologist. Hearing and body mass index were assessed and reviewed.   During the course of the visit the patient was educated and counseled about appropriate screening and preventive services including : fall prevention , diabetes screening, nutrition counseling, colorectal cancer screening, and recommended immunizations.    CC: The primary encounter diagnosis was Hip pain, acute, right. Diagnoses of Breast cancer screening, Pain in joint of right shoulder, Vitamin D deficiency, Fatigue, unspecified type, and Atherosclerosis of aorta (HCC) were also pertinent to this visit.  Seen by Bridget [name] in August for right shoulder and upper back pain.  Pain is intermittent  And aggravated by kitchen work,  [employer]    .  Was prescribed prednisone by an PA at the  Orthopedics at [place], has not started it yet.    New onset right hip pain started yesterday while sitting down,  Severe . No history of falls,  autoimmmune disease etc.    History Bridget [name] has a past medical history of Diverticulosis of colon without diverticulitis and GERD (gastroesophageal reflux disease).   She has a past surgical history that includes Oophorectomy; Abdominal hysterectomy; and Toe Surgery (06/2015).   Her family history includes Cancer in her brother, brother, sister, sister, and sister; Heart  failure in her mother.She reports that she has never smoked. She has never used smokeless tobacco. She reports that she does not drink alcohol or use drugs.  Outpatient Medications Prior to Visit  Medication Sig Dispense Refill  . Calcium Carbonate-Vitamin D (CALCIUM + D) 600-200 MG-UNIT TABS Take 1 tablet by mouth daily.      . Multiple Vitamins-Minerals  (CENTRUM SILVER PO) Take 1 tablet by mouth daily.      . pantoprazole (PROTONIX) 40 MG tablet TAKE 1 TABLET BY MOUTH ONCE DAILY 30 tablet 3  . diclofenac sodium (VOLTAREN) 1 % GEL Apply 4 g topically 4 (four) times daily. (Patient not taking: Reported on 03/27/2017) 1 Tube 3  . lidocaine (LIDODERM) 5 % Place 1 patch onto the skin daily. Remove & Discard patch within 12 hours. (Patient not taking: Reported on 03/27/2017) 30 patch 0  . meloxicam (MOBIC) 7.5 MG tablet Take 1 tablet (7.5 mg total) by mouth daily. (Patient not taking: Reported on 03/27/2017) 10 tablet 0  . Tdap (BOOSTRIX) 5-2.5-18.5 LF-MCG/0.5 injection Inject 0.5 mLs into the muscle once. (Patient not taking: Reported on 03/27/2017) 0.5 mL 0   No facility-administered medications prior to visit.     Review of Systems   Patient denies headache, fevers, malaise, unintentional weight loss, skin rash, eye pain, sinus congestion and sinus pain, sore throat, dysphagia,  hemoptysis , cough, dyspnea, wheezing, chest pain, palpitations, orthopnea, edema, abdominal pain, nausea, melena, diarrhea, constipation, flank pain, dysuria, hematuria, urinary  Frequency, nocturia, numbness, tingling, seizures,  Focal weakness, Loss of consciousness,  Tremor, insomnia, depression, anxiety, and suicidal ideation.      Objective:  BP (!) 142/88 (BP Location: Left Arm, Patient Position: Sitting, Cuff Size: Normal)   Pulse 60   Temp 98.1 F (36.7 C) (Oral)   Resp 15   Ht 5\' 5"  (1.651 m)   Wt 155 lb 6.4 oz (70.5 kg)   SpO2 97%   BMI 25.86 kg/m   Physical Exam   General appearance: alert, cooperative and appears stated age Head: Normocephalic, without obvious abnormality, atraumatic Eyes: conjunctivae/corneas clear. PERRL, EOM's intact. Fundi benign. Ears: normal TM's and external ear canals both ears Nose: Nares normal. Septum midline. Mucosa normal. No drainage or sinus tenderness. Throat: lips, mucosa, and tongue normal; teeth and gums normal Neck:  no adenopathy, no carotid bruit, no JVD, supple, symmetrical, trachea midline and thyroid not enlarged, symmetric, no tenderness/mass/nodules Lungs: clear to auscultation bilaterally Breasts: normal appearance, no masses or tenderness Heart: regular rate and rhythm, S1, S2 normal, no murmur, click, rub or gallop Abdomen: soft, non-tender; bowel sounds normal; no masses,  no organomegaly Extremities: extremities normal, atraumatic, no cyanosis or edema Pulses: 2+ and symmetric Skin: Skin color, texture, turgor normal. No rashes or lesions Neurologic: Alert and oriented X 3, normal strength and tone. Normal symmetric reflexes. Normal coordination and gait.      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  Recommend use of tylenol ,ice infrequent use of NSAIDs      Relevant Orders   DG HIP UNILAT WITH PELVIS 2-3 VIEWS RIGHT (Completed)   Sedimentation rate (Completed)   Pain in joint of right shoulder   Relevant Orders   Sedimentation rate (Completed)    Other Visit Diagnoses    Breast cancer  screening       Relevant Orders   MM SCREENING BREAST TOMO BILATERAL   Vitamin D deficiency       Relevant Orders   VITAMIN D 25 Hydroxy (Vit-D Deficiency, Fractures) (Completed)   Fatigue, unspecified type       Relevant Orders   TSH (Completed)   Comprehensive metabolic panel (Completed)   Atherosclerosis of aorta (HCC)       Relevant Orders   Lipid panel (Completed)      I have discontinued Ms. Yasuda's Tdap. I am also having her maintain her Multiple Vitamins-Minerals (CENTRUM SILVER PO), Calcium Carbonate-Vitamin D, pantoprazole, meloxicam, lidocaine, and diclofenac sodium.  No orders of the defined types were placed in this encounter.   Medications Discontinued During This Encounter  Medication Reason  . Tdap (BOOSTRIX) 5-2.5-18.5  LF-MCG/0.5 injection Patient has not taken in last 30 days    Follow-up: No Follow-up on file.   Crecencio Mc, MD

## 2017-03-29 DIAGNOSIS — M25551 Pain in right hip: Secondary | ICD-10-CM

## 2017-03-29 DIAGNOSIS — M25511 Pain in right shoulder: Secondary | ICD-10-CM | POA: Insufficient documentation

## 2017-03-29 DIAGNOSIS — G8929 Other chronic pain: Secondary | ICD-10-CM | POA: Insufficient documentation

## 2017-03-29 NOTE — Assessment & Plan Note (Addendum)
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  Recommend that she start the prednisone taper given to her by  thenfrequent use of NSAIDs

## 2017-03-29 NOTE — Assessment & Plan Note (Signed)
Managed  with daily regimen of BFLs

## 2017-03-31 NOTE — Telephone Encounter (Signed)
Called pt to schedule AWV. No answer, No vm, Phone just kept ringing

## 2017-04-07 ENCOUNTER — Telehealth: Payer: Self-pay | Admitting: *Deleted

## 2017-04-07 NOTE — Telephone Encounter (Signed)
Pt requested Xray results Pt contact [phone]

## 2017-04-08 ENCOUNTER — Other Ambulatory Visit: Payer: Self-pay | Admitting: Internal Medicine

## 2017-04-08 MED ORDER — MELOXICAM 7.5 MG PO TABS: 7.5000 mg | ORAL_TABLET | Freq: Every day | ORAL | 1 refills | Status: DC

## 2017-04-08 NOTE — Telephone Encounter (Signed)
There is no indication for MRI at this point unless the pain is shooting to the ankle and does not respond to a trial of medication first,

## 2017-04-08 NOTE — Telephone Encounter (Signed)
meloxicam 7.5 mg sent to [facility]

## 2017-04-08 NOTE — Telephone Encounter (Signed)
Please advise 

## 2017-04-08 NOTE — Telephone Encounter (Signed)
Her x rays showed that her Hip joint is fine. Marland Kitchenspine is fine as well, except for  degenerative changes at  The lower lumbar spine  And at the SI joint (the sacroiliac joint is where we all have a dimple on either side of our lower spine at the top of each  buttock) .  This causes arthritis in these joints are very likely the cause of her pain . If pain is not shooting down leg,  No need for MRI.  Can use meloxicam and tylenol.

## 2017-04-08 NOTE — Telephone Encounter (Signed)
Spoke with pt and informed her of her xray results. The pt stated that she does have pain in her upper thigh and would like to go forward with the MRI.

## 2017-04-08 NOTE — Telephone Encounter (Signed)
Pt stated that she would like to have rx sent in of the meloxicam and let you know that she is already taking the tylenol to get some relief.

## 2017-04-09 NOTE — Telephone Encounter (Signed)
Pt was notified.  

## 2017-04-14 ENCOUNTER — Ambulatory Visit (INDEPENDENT_AMBULATORY_CARE_PROVIDER_SITE_OTHER): Payer: PPO | Admitting: Podiatry

## 2017-04-14 DIAGNOSIS — L600 Ingrowing nail: Secondary | ICD-10-CM | POA: Diagnosis not present

## 2017-04-14 NOTE — Progress Notes (Signed)
   Subjective: Patient presents today for evaluation of pain to the lateral border of the left great toe and the medial border of the second toe of the left foot that began approximately 3-4 weeks ago. Patient is concerned for possible ingrown nail. She has been soaking the foot in Epsom salt and applying Neosporin to the areas. Patient presents today for further treatment and evaluation.   Past Medical History:  Diagnosis Date  . Diverticulosis of colon without diverticulitis   . GERD (gastroesophageal reflux disease)     Objective:  General: Well developed, nourished, in no acute distress, alert and oriented x3   Dermatology: Skin is warm, dry and supple bilateral. Lateral border of the left great toe and medial border of the left second toe appear to be erythematous with evidence of ingrowing nails. Pain on palpation noted to the border of the nail folds. The remaining nails appear unremarkable at this time. There are no open sores, lesions.  Vascular: Dorsalis Pedis artery and Posterior Tibial artery pedal pulses palpable. No lower extremity edema noted.   Neruologic: Grossly intact via light touch bilateral.  Musculoskeletal: Muscular strength within normal limits in all groups bilateral. Normal range of motion noted to all pedal and ankle joints.   Assesement: #1 Paronychia with ingrowing nail lateral border of great toe and medial border of second toe-left foot #2 Pain in toes #3 Incurvated nails  Plan of Care:  1. Patient evaluated.  2. Discussed treatment alternatives and plan of care. Explained nail avulsion procedure and post procedure course to patient. 3. Patient opted for permanent partial nail avulsion.  4. Prior to procedure, local anesthesia infiltration utilized using 3 ml of a 50:50 mixture of 2% plain lidocaine and 0.5% plain marcaine in a normal hallux block and digital block fashion and a betadine prep performed.  5. Partial permanent nail avulsion with chemical  matrixectomy performed using [id number] applications of phenol followed by alcohol flush.  6. Light dressing applied. 7. Return to clinic in 2 weeks.   Edrick Kins, DPM [facility]  Dr. Edrick Kins, [place]                                        [place], [place] [address]                Office [phone]  Fax [phone]

## 2017-04-14 NOTE — Patient Instructions (Signed)

## 2017-04-28 ENCOUNTER — Ambulatory Visit (INDEPENDENT_AMBULATORY_CARE_PROVIDER_SITE_OTHER): Payer: PPO | Admitting: Podiatry

## 2017-04-28 DIAGNOSIS — L600 Ingrowing nail: Secondary | ICD-10-CM

## 2017-05-01 NOTE — Progress Notes (Signed)
   Subjective: Patient presents today 2 weeks post ingrown nail permanent nail avulsion procedure. Patient states that the toe and nail fold is feeling much better.  Past Medical History:  Diagnosis Date  . Diverticulosis of colon without diverticulitis   . GERD (gastroesophageal reflux disease)     Objective: Skin is warm, dry and supple. Nail and respective nail fold appears to be healing appropriately. Open wound to the associated nail fold with a granular wound base and moderate amount of fibrotic tissue. Minimal drainage noted. Mild erythema around the periungual region likely due to phenol chemical matricectomy.  Assessment: #1 postop permanent partial nail avulsion lateral border of the left great toe and medial border of left 2nd toe #2 open wound periungual nail fold of respective digit.   Plan of care: #1 patient was evaluated  #2 debridement of open wound was performed to the periungual border of the respective toe using a currette. Antibiotic ointment and Band-Aid was applied. #3 patient is to return to clinic on a PRN  basis.   Edrick Kins, DPM [facility]  Dr. Edrick Kins, [address]                                        [place], [place] [address]                Office [phone]  Fax [phone]

## 2017-05-11 ENCOUNTER — Telehealth: Payer: Self-pay | Admitting: Internal Medicine

## 2017-05-11 DIAGNOSIS — K210 Gastro-esophageal reflux disease with esophagitis: Secondary | ICD-10-CM | POA: Diagnosis not present

## 2017-05-11 NOTE — Telephone Encounter (Signed)
Pt called about needing to be seen today due to stomach issues pt took two of her acid reflux pills and still having trouble. Pt called the ER yesterday and was told to see her dr that something else may be going on. Is it ok to sch pt at 11:30 today? Please advise?  Call pt @ [phone]. Thank you!

## 2017-05-11 NOTE — Telephone Encounter (Signed)
Patient called complaining of acid reflux. Symptoms started yesterday and she was feeling like she was full and then her stomach started burning. Took tums and her protonix. Feels like she has to burp constantly. Advised patient to go to ED or walk in to be evaluated to rule out heart attack. Patient agreed to comply.

## 2017-08-31 ENCOUNTER — Other Ambulatory Visit: Payer: Self-pay | Admitting: Internal Medicine

## 2017-09-11 ENCOUNTER — Ambulatory Visit: Payer: PPO | Admitting: Podiatry

## 2017-09-11 ENCOUNTER — Encounter: Payer: Self-pay | Admitting: Podiatry

## 2017-09-11 DIAGNOSIS — B351 Tinea unguium: Secondary | ICD-10-CM

## 2017-09-11 DIAGNOSIS — M79676 Pain in unspecified toe(s): Secondary | ICD-10-CM | POA: Diagnosis not present

## 2017-09-11 DIAGNOSIS — Q828 Other specified congenital malformations of skin: Secondary | ICD-10-CM

## 2017-09-14 ENCOUNTER — Telehealth: Payer: Self-pay | Admitting: Internal Medicine

## 2017-09-14 NOTE — Telephone Encounter (Unsigned)
Copied from [place]. Topic: Appointment Scheduling - Scheduling Inquiry for Clinic >> Sep 14, 2017  5:25 PM Percell Belt A wrote: Reason for CRM:  No appts for the next 2 weeks pt is having stomach pains and would like to be worked in   [employer] number [place] number

## 2017-09-14 NOTE — Progress Notes (Signed)
    Subjective: Patient is a 84 y.o. female presenting to the office today with a chief complaint of a painful callus lesion to the right foot that has been present for several months. Applying pressure to the area increases the pain. She has not done anything to treat the symptoms.   Patient also complains of elongated, thickened nails that cause pain while ambulating in shoes. Patient is unable to trim their own nails. Patient presents today for further treatment and evaluation.  Past Medical History:  Diagnosis Date  . Diverticulosis of colon without diverticulitis   . GERD (gastroesophageal reflux disease)     Objective:  Physical Exam General: Alert and oriented x3 in no acute distress  Dermatology: Hyperkeratotic lesion present on the right foot. Pain on palpation with a central nucleated core noted. Skin is warm, dry and supple bilateral lower extremities. Negative for open lesions or macerations. Nails are tender, long, thickened and dystrophic with subungual debris, consistent with onychomycosis, 1-5 bilateral. No signs of infection noted.  Vascular: Palpable pedal pulses bilaterally. No edema or erythema noted. Capillary refill within normal limits.  Neurological: Epicritic and protective threshold grossly intact bilaterally.   Musculoskeletal Exam: Pain on palpation at the keratotic lesion noted. Range of motion within normal limits bilateral. Muscle strength 5/5 in all groups bilateral.  Assessment: 1. Onychodystrophic nails 1-5 bilateral with hyperkeratosis of nails.  2. Onychomycosis of nail due to dermatophyte bilateral 3. Porokeratosis to the right foot   Plan of Care:  #1 Patient evaluated. #2 Excisional debridement of keratoic lesion using a chisel blade was performed without incident.  #3 Treated area with Salinocaine and light dressing applied. #4 Mechanical debridement of nails 1-5 bilaterally performed using a nail nipper. Filed with dremel without incident.    #5 Patient is to return as needed.   Edrick Kins, DPM [facility]  Dr. Edrick Kins, [address]                                        [place], [place] [address]                Office [phone]  Fax [phone]

## 2017-09-15 DIAGNOSIS — R1032 Left lower quadrant pain: Secondary | ICD-10-CM | POA: Diagnosis not present

## 2017-09-15 DIAGNOSIS — K5792 Diverticulitis of intestine, part unspecified, without perforation or abscess without bleeding: Secondary | ICD-10-CM | POA: Diagnosis not present

## 2017-09-15 NOTE — Telephone Encounter (Signed)
fyi

## 2017-09-15 NOTE — Telephone Encounter (Signed)
Patient states the pain starts in her lower back to her stomach. Patient states her bowel movements are small, the size of her finger tips. Patient has had this pain for 2 days. Pain is off and on. Denies any urinary symptoms. No fever. Informed patient we do not have any availability but she should be evaluated. Patient states she will go to [facility] walk in. Patient states she is unable to be evaluated today. Informed patient to be evaluated today if she develops any worsening symptoms, worsening pain or fever.

## 2017-09-25 ENCOUNTER — Emergency Department: Payer: PPO

## 2017-09-25 ENCOUNTER — Emergency Department
Admission: EM | Admit: 2017-09-25 | Discharge: 2017-09-25 | Disposition: A | Discharge status: Home / Self Care | Payer: PPO | Attending: Emergency Medicine | Admitting: Emergency Medicine

## 2017-09-25 ENCOUNTER — Other Ambulatory Visit: Payer: Self-pay

## 2017-09-25 DIAGNOSIS — R079 Chest pain, unspecified: Secondary | ICD-10-CM | POA: Diagnosis not present

## 2017-09-25 DIAGNOSIS — R002 Palpitations: Secondary | ICD-10-CM | POA: Insufficient documentation

## 2017-09-25 DIAGNOSIS — Z5321 Procedure and treatment not carried out due to patient leaving prior to being seen by health care provider: Secondary | ICD-10-CM | POA: Insufficient documentation

## 2017-09-25 DIAGNOSIS — R0789 Other chest pain: Secondary | ICD-10-CM | POA: Diagnosis not present

## 2017-09-25 LAB — CBC
HEMATOCRIT: 38.8 % (ref 35.0–47.0)
HEMOGLOBIN: 13 g/dL (ref 12.0–16.0)
MCH: 30.4 pg (ref 26.0–34.0)
MCHC: 33.5 g/dL (ref 32.0–36.0)
MCV: 90.8 fL (ref 80.0–100.0)
Platelets: 310 10*3/uL (ref 150–440)
RBC: 4.28 MIL/uL (ref 3.80–5.20)
RDW: 13.6 % (ref 11.5–14.5)
WBC: 5.6 10*3/uL (ref 3.6–11.0)

## 2017-09-25 LAB — TROPONIN I
Troponin I: <0.03 ng/mL (ref <0.03)
Troponin I: <0.03 ng/mL (ref <0.03)

## 2017-09-25 LAB — BASIC METABOLIC PANEL
ANION GAP: 7 (ref 5–15)
BUN: 11 mg/dL (ref 6–20)
CALCIUM: 9.9 mg/dL (ref 8.9–10.3)
CO2: 26 mmol/L (ref 22–32)
Chloride: 107 mmol/L (ref 101–111)
Creatinine, Ser: 0.71 mg/dL (ref 0.44–1.00)
GFR calc Af Amer: >60 mL/min (ref >60)
GFR calc non Af Amer: >60 mL/min (ref >60)
GLUCOSE: 111 mg/dL — AB (ref 65–99)
POTASSIUM: 3.6 mmol/L (ref 3.5–5.1)
Sodium: 140 mmol/L (ref 135–145)

## 2017-09-25 NOTE — ED Notes (Addendum)
FN: pt brought over from [facility] with reports of having palpitations on and off for a couple of days. Pt denies any chest pain at this time. Pt with NAD noted at time of check in.

## 2017-09-25 NOTE — ED Triage Notes (Signed)
Pt to ER via POV c/o heart fluttering at night since Monday. Denies CP. Pt alert and oriented X4, active, cooperative, pt in NAD. RR even and unlabored, color WNL.

## 2017-09-26 ENCOUNTER — Emergency Department
Admission: EM | Admit: 2017-09-26 | Discharge: 2017-09-26 | Disposition: A | Discharge status: Home / Self Care | Payer: PPO | Attending: Emergency Medicine | Admitting: Emergency Medicine

## 2017-09-26 ENCOUNTER — Encounter: Payer: Self-pay | Admitting: Emergency Medicine

## 2017-09-26 ENCOUNTER — Other Ambulatory Visit: Payer: Self-pay

## 2017-09-26 ENCOUNTER — Emergency Department: Payer: PPO

## 2017-09-26 DIAGNOSIS — R0789 Other chest pain: Secondary | ICD-10-CM | POA: Insufficient documentation

## 2017-09-26 DIAGNOSIS — R002 Palpitations: Secondary | ICD-10-CM | POA: Diagnosis not present

## 2017-09-26 DIAGNOSIS — R079 Chest pain, unspecified: Secondary | ICD-10-CM | POA: Diagnosis not present

## 2017-09-26 LAB — BASIC METABOLIC PANEL
ANION GAP: 5 (ref 5–15)
BUN: 10 mg/dL (ref 6–20)
CO2: 28 mmol/L (ref 22–32)
CREATININE: 0.71 mg/dL (ref 0.44–1.00)
Calcium: 10 mg/dL (ref 8.9–10.3)
Chloride: 108 mmol/L (ref 101–111)
GFR calc non Af Amer: >60 mL/min (ref >60)
Glucose, Bld: 97 mg/dL (ref 65–99)
POTASSIUM: 3.8 mmol/L (ref 3.5–5.1)
SODIUM: 141 mmol/L (ref 135–145)

## 2017-09-26 LAB — CBC
HCT: 39.8 % (ref 35.0–47.0)
Hemoglobin: 13.3 g/dL (ref 12.0–16.0)
MCH: 30 pg (ref 26.0–34.0)
MCHC: 33.5 g/dL (ref 32.0–36.0)
MCV: 89.5 fL (ref 80.0–100.0)
PLATELETS: 299 10*3/uL (ref 150–440)
RBC: 4.45 MIL/uL (ref 3.80–5.20)
RDW: 13.5 % (ref 11.5–14.5)
WBC: 5.6 10*3/uL (ref 3.6–11.0)

## 2017-09-26 LAB — TROPONIN I: Troponin I: <0.03 ng/mL (ref <0.03)

## 2017-09-26 NOTE — ED Triage Notes (Signed)
Pt to ed with c/o heart fluttering intermittently since Monday night.  Pt states chest discomfort.  Pt here yesterday for same, but left before seeing the MD.  Pt in no distress at this time. Denies sob.

## 2017-09-26 NOTE — ED Provider Notes (Signed)
[facility] Emergency Department Provider Note   ____________________________________________   First MD Initiated Contact with Patient 09/26/17 [phone]     (approximate)  I have reviewed the triage vital signs and the nursing notes.   HISTORY  Chief Complaint Chest Pain    HPI Bridget [name] is a 84 y.o. female patient reports she had been to urgent care last week and had gotten antibiotics and other medicines for diverticulitis. Review of the records show she got Cipro and Flagyl and Phenergan. She said after being on the medicines for a couple days her heart began fluttering. She had fluttering so bad at least one night she couldn't sleep. She stopped the antibiotics and the fluttering is gone away. She is not having any more belly pain either. She came back today for recheck. She was here yesterday but had to leave before the doctor did see her.   Past Medical History:  Diagnosis Date  . Diverticulosis of colon without diverticulitis   . GERD (gastroesophageal reflux disease)     Patient Active Problem List   Diagnosis Date Noted  . Pain in joint of right shoulder 03/29/2017  . Hip pain, acute, right 03/29/2017  . Weight gain 10/14/2015  . Edema 10/14/2015  . Constipation 01/20/2015  . Multinodular goiter 06/23/2013  . Urinary hesitancy 01/22/2013  . Diverticulosis of colon without diverticulitis   . Menopausal sweats 02/25/2012  . Routine general medical examination at a health care facility 02/25/2012  . Osteopenia 08/01/2011  . GERD (gastroesophageal reflux disease)     Past Surgical History:  Procedure Laterality Date  . ABDOMINAL HYSTERECTOMY     Not due to cancer, due to fibroids, BSO  . OOPHORECTOMY     Bilateral  . TOE SURGERY  06/2015   R Big toe Ingrown toenail removed      Prior to Admission medications   Medication Sig Start Date End Date Taking? Authorizing Provider  acetaminophen (TYLENOL) 500 MG tablet Take 1,000 mg by  mouth daily as needed.   Yes [provider]  Calcium Carbonate-Vitamin D (CALCIUM + D) 600-200 MG-UNIT TABS Take 1 tablet by mouth daily.     Yes [provider]  Multiple Vitamins-Minerals (CENTRUM SILVER PO) Take 1 tablet by mouth daily.     Yes [provider]  pantoprazole (PROTONIX) 40 MG tablet TAKE 1 TABLET BY MOUTH ONCE DAILY 08/31/17  Yes Crecencio Mc, MD  diclofenac sodium (VOLTAREN) 1 % GEL Apply 4 g topically 4 (four) times daily. Patient not taking: Reported on 03/27/2017 03/05/17   Burnard Hawthorne, FNP  lidocaine (LIDODERM) 5 % Place 1 patch onto the skin daily. Remove & Discard patch within 12 hours. Patient not taking: Reported on 03/27/2017 03/05/17   Burnard Hawthorne, FNP  meloxicam (MOBIC) 7.5 MG tablet Take 1 tablet (7.5 mg total) by mouth daily. Patient not taking: Reported on 09/26/2017 04/08/17   Crecencio Mc, MD    Allergies Sulfa antibiotics  Family History  Problem Relation Age of Onset  . Heart failure Mother        Massive MI  . Cancer Brother        prostate  . Cancer Sister        colon  . Cancer Sister        lung  . Cancer Sister        bone  . Cancer Brother        lung (smoker)  Social History Social History   Tobacco Use  . Smoking status: Never Smoker  . Smokeless tobacco: Never Used  Substance Use Topics  . Alcohol use: No  . Drug use: No    Review of Systems  Constitutional: No fever/chills Eyes: No visual changes. ENT: No sore throat. Cardiovascular: Denies chest pain. Respiratory: Denies shortness of breath. Gastrointestinal: No abdominal pain.  No nausea, no vomiting.  No diarrhea.  No constipation. Genitourinary: Negative for dysuria. Musculoskeletal: Negative for back pain. Skin: Negative for rash. Neurological: Negative for headaches, focal weakness  ____________________________________________   PHYSICAL EXAM:  VITAL SIGNS: ED Triage Vitals [09/26/17 0858]  Enc Vitals Group      BP 139/78     Pulse Rate 78     Resp 18     Temp 98.3 F (36.8 C)     Temp Source Oral     SpO2 98 %     Weight 156 lb (70.8 kg)     Height 5\' 5"  (1.651 m)     Head Circumference      Peak Flow      Pain Score      Pain Loc      Pain Edu?      Excl. in [place]?     Constitutional: Alert and oriented. Well appearing and in no acute distress. Eyes: Conjunctivae are normal.  Head: Atraumatic. Nose: No congestion/rhinnorhea. Mouth/Throat: Mucous membranes are moist.  Oropharynx non-erythematous. Neck: No stridor.   Cardiovascular: Normal rate, regular rhythm. Grossly normal heart sounds.  Good peripheral circulation. Respiratory: Normal respiratory effort.  No retractions. Lungs CTAB. Gastrointestinal: Soft and nontender. No distention. No abdominal bruits. No CVA tenderness. Musculoskeletal: No lower extremity tenderness nor edema.  No joint effusions. Neurologic:  Normal speech and language. No gross focal neurologic deficits are appreciated. No gait instability. Skin:  Skin is warm, dry and intact. No rash noted. Psychiatric: Mood and affect are normal. Speech and behavior are normal.  ____________________________________________   LABS (all labs ordered are listed, but only abnormal results are displayed)  Labs Reviewed  BASIC METABOLIC PANEL  CBC  TROPONIN I   ____________________________________________  EKG  EKG read and interpreted by me shows normal sinus rhythm rate of 75 left axis no acute ST-T wave changes ____________________________________________  RADIOLOGY  ED MD interpretation:  chest x-ray done yesterday and reviewed by me looks normal.  Official radiology report(s): No results found.  ____________________________________________   PROCEDURES  Procedure(s) performed:   Procedures  Critical Care performed:   ____________________________________________   INITIAL IMPRESSION / ASSESSMENT AND PLAN / ED COURSE  troponin today is negative  troponins yesterday were negative patient was taking Cipro and Flagyl both of which can occasionally cause palpitations. I have told her not to take this anymore to return for any further problems and follow-up with her doctor.       ____________________________________________   FINAL CLINICAL IMPRESSION(S) / ED DIAGNOSES  Final diagnoses:  Palpitations     ED Discharge Orders    None       Note:  This document was prepared using Dragon voice recognition software and may include unintentional dictation errors.    Nena Polio, MD 09/26/17 1139

## 2017-09-26 NOTE — ED Notes (Signed)
Pt to be discharged - vs not updated, so updated at this time

## 2017-09-26 NOTE — Discharge Instructions (Signed)
the palpitations or heart fluttering you were feeling could've been caused by the Cipro and Flagyl with 2 antibiotics you're taking. It would be best to avoid those in the future. Please follow-up with your regular doctor. Please return if you have any further heart fluttering or chest tightness or any other symptoms.

## 2017-10-29 ENCOUNTER — Ambulatory Visit (INDEPENDENT_AMBULATORY_CARE_PROVIDER_SITE_OTHER): Payer: PPO

## 2017-10-29 VITALS — BP 130/70 | HR 60 | Temp 98.3°F | Resp 14 | Ht 65.0 in | Wt 155.8 lb

## 2017-10-29 DIAGNOSIS — Z0000 Encounter for general adult medical examination without abnormal findings: Secondary | ICD-10-CM

## 2017-10-29 NOTE — Patient Instructions (Addendum)
  Ms. Wank , Thank you for taking time to come for your Medicare Wellness Visit. I appreciate your ongoing commitment to your health goals. Please review the following plan we discussed and let me know if I can assist you in the future.   Follow up as needed.    Bring a copy of your [place] and/or Living Will to be scanned into chart.  Have a great day!  These are the goals we discussed: Goals    . Increase physical activity     Water aerobics at the [facility].  Schedule provided.       This is a list of the screening recommended for you and due dates:  Health Maintenance  Topic Date Due  . Tetanus Vaccine  12/01/1949  . Flu Shot  03/27/2018*  . DEXA scan (bone density measurement)  Completed  . Pneumonia vaccines  Completed  *Topic was postponed. The date shown is not the original due date.

## 2017-10-29 NOTE — Progress Notes (Signed)
Subjective:   Bridget [name] is a 84 y.o. female who presents for Medicare Annual (Subsequent) preventive examination.  Review of Systems:  No ROS.  Medicare Wellness Visit. Additional risk factors are reflected in the social history.  Cardiac Risk Factors include: advanced age (>15men, >43 women)     Objective:     Vitals: BP 130/70 (BP Location: Left Arm, Patient Position: Sitting, Cuff Size: Normal)   Pulse 60   Temp 98.3 F (36.8 C) (Oral)   Resp 14   Ht 5\' 5"  (1.651 m)   Wt 155 lb 12.8 oz (70.7 kg)   SpO2 97%   BMI 25.93 kg/m   Body mass index is 25.93 kg/m.  Advanced Directives 10/29/2017 09/25/2017 07/25/2015 04/09/2015  Does Patient Have a Medical Advance Directive? No No Yes Yes  Type of Advance Directive - Public librarian;Living will Living will  Does patient want to make changes to medical advance directive? - - No - Patient declined No - Patient declined  Copy of [place] in Chart? - - No - copy requested -  Would patient like information on creating a medical advance directive? No - Patient declined - - -    Tobacco Social History   Tobacco Use  Smoking Status Never Smoker  Smokeless Tobacco Never Used     Counseling given: Not Answered   Clinical Intake:  Pre-visit preparation completed: Yes  Pain : No/denies pain     Nutritional Status: BMI 25 -29 Overweight Diabetes: No  How often do you need to have someone help you when you read instructions, pamphlets, or other written materials from your doctor or pharmacy?: 1 - Never  Interpreter Needed?: No     Past Medical History:  Diagnosis Date  . Diverticulosis of colon without diverticulitis   . GERD (gastroesophageal reflux disease)    Past Surgical History:  Procedure Laterality Date  . ABDOMINAL HYSTERECTOMY     Not due to cancer, due to fibroids, BSO  . OOPHORECTOMY     Bilateral  . TOE SURGERY  06/2015   R Big toe Ingrown toenail removed      Family History  Problem Relation Age of Onset  . Heart failure Mother        Massive MI  . Cancer Brother        prostate  . Cancer Sister        colon  . Cancer Sister        lung  . Cancer Sister        bone  . Cancer Brother        lung (smoker)   Social History   Socioeconomic History  . Marital status: Married    Spouse name: Not on file  . Number of children: Not on file  . Years of education: Not on file  . Highest education level: Not on file  Occupational History  . 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 Financial resource strain: Not hard at all  . Food insecurity:    Worry: Never true    Inability: Never true  . Transportation needs:    Medical: No    Non-medical: No  Tobacco Use  . Smoking status: Never Smoker  . Smokeless tobacco: Never Used  Substance and Sexual Activity  . Alcohol use: No  . Drug use: No  . Sexual activity: Not Currently  Lifestyle  .  Physical activity:    Days per week: Not on file    Minutes per session: Not on file  . Stress: Not on file  Relationships  . Social connections:    Talks on phone: Not on file    Gets together: Not on file    Attends religious service: Not on file    Active member of club or organization: Not on file    Attends meetings of clubs or organizations: Not on file    Relationship status: Not on file  Other Topics Concern  . Not on file  Social History Narrative  . Not on file    Outpatient Encounter Medications as of 10/29/2017  Medication Sig  . acetaminophen (TYLENOL) 500 MG tablet Take 1,000 mg by mouth daily as needed.  . Calcium Carbonate-Vitamin D (CALCIUM + D) 600-200 MG-UNIT TABS Take 1 tablet by mouth daily.    . diclofenac sodium (VOLTAREN) 1 % GEL Apply 4 g topically 4 (four) times daily.  Marland Kitchen lidocaine (LIDODERM) 5 % Place 1 patch onto the skin daily. Remove & Discard patch within 12 hours.  . meloxicam (MOBIC) 7.5 MG tablet Take 1  tablet (7.5 mg total) by mouth daily.  . Multiple Vitamins-Minerals (CENTRUM SILVER PO) Take 1 tablet by mouth daily.    . pantoprazole (PROTONIX) 40 MG tablet TAKE 1 TABLET BY MOUTH ONCE DAILY   No facility-administered encounter medications on file as of 10/29/2017.     Activities of Daily Living In your present state of health, do you have any difficulty performing the following activities: 10/29/2017  Hearing? N  Vision? N  Difficulty concentrating or making decisions? Y  Comment Some difficulty remembering  Walking or climbing stairs? Y  Comment R knee pain, intermittent  Dressing or bathing? N  Doing errands, shopping? N  Preparing Food and eating ? N  Using the Toilet? N  In the past six months, have you accidently leaked urine? N  Do you have problems with loss of bowel control? N  Managing your Medications? N  Managing your Finances? N  Housekeeping or managing your Housekeeping? N  Some recent data might be hidden    Patient Care Team: Crecencio Mc, MD as PCP - General (Internal Medicine)    Assessment:   This is a routine wellness examination for Bridget [name].  The goal of the wellness visit is to assist the patient how to close the gaps in care and create a preventative care plan for the patient.   The roster of all physicians providing medical care to patient is listed in the Snapshot section of the chart.  Taking calcium VIT D as appropriate/Osteoporosis risk reviewed.    Safety issues reviewed; Smoke and carbon monoxide detectors in the home. No firearms or firearms locked in a safe within the home. Wears seatbelts when driving or riding with others. No violence in the home.  They do not have excessive sun exposure.  Discussed the need for sun protection: hats, long sleeves and the use of sunscreen if there is significant sun exposure.  Patient is alert, normal appearance, oriented to person/place/and time.  Correctly identified the president of the [place] and  recalls of 3/3 words. Performs simple calculations and can read correct time from watch face. Displays appropriate judgement.  No new identified risk were noted.  No failures at ADL's or IADL's.    BMI- discussed the importance of a healthy diet, water intake and the benefits of aerobic exercise. Educational material provided.  24 hour diet recall: Regular diet  Dental- every 12 months.  Eye- Visual acuity not assessed per patient preference since they have regular follow up with the ophthalmologist.  Wears corrective lenses.  Sleep patterns- Sleeps 6 hours at night.  Wakes feeling rested.  TDAP vaccine deferred per patient preference.  Follow up with insurance.  Educational material provided.  Patient Concerns: None at this time. Follow up with PCP as needed.  Exercise Activities and Dietary recommendations    Goals    . Increase physical activity     Water aerobics at the [facility]  Schedule provided.       Fall Risk Fall Risk  10/29/2017 03/05/2017 07/25/2015 01/30/2015  Falls in the past year? No No No No   Depression Screen PHQ 2/9 Scores 10/29/2017 03/05/2017 10/12/2015 07/25/2015  PHQ - 2 Score 0 0 0 0     Cognitive Function MMSE - Mini Mental State Exam 07/25/2015  Orientation to time 5  Orientation to Place 5  Registration 3  Attention/ Calculation 5  Recall 3  Language- name 2 objects 2  Language- repeat 1  Language- follow 3 step command 3  Language- read & follow direction 1  Write a sentence 1  Copy design 1  Total score 30     6CIT Screen 10/29/2017  What Year? 0 points  What month? 0 points  What time? 0 points  Count back from 20 0 points  Months in reverse 0 points  Repeat phrase 0 points  Total Score 0    Immunization History  Administered Date(s) Administered  . Pneumococcal Conjugate-13 10/12/2015  . Pneumococcal Polysaccharide-23 02/25/2012   Screening Tests Health Maintenance  Topic Date Due  . TETANUS/TDAP  12/01/1949  . INFLUENZA  VACCINE  03/27/2018 (Originally 02/18/2018)  . DEXA SCAN  Completed  . PNA vac Low Risk Adult  Completed      Plan:   End of life planning; Advanced aging; Advanced directives discussed.  No HCPOA/Living Will.  Additional information declined at this time.  I have personally reviewed and noted the following in the patient's chart:   . Medical and social history . Use of alcohol, tobacco or illicit drugs  . Current medications and supplements . Functional ability and status . Nutritional status . Physical activity . Advanced directives . List of other physicians . Hospitalizations, surgeries, and ER visits in previous 12 months . Vitals . Screenings to include cognitive, depression, and falls . Referrals and appointments  In addition, I have reviewed and discussed with patient certain preventive protocols, quality metrics, and best practice recommendations. A written personalized care plan for preventive services as well as general preventive health recommendations were provided to patient.     Varney Biles, LPN  11/21/5463

## 2017-12-26 DIAGNOSIS — M542 Cervicalgia: Secondary | ICD-10-CM | POA: Diagnosis not present

## 2017-12-26 DIAGNOSIS — M50321 Other cervical disc degeneration at C4-C5 level: Secondary | ICD-10-CM | POA: Diagnosis not present

## 2017-12-26 DIAGNOSIS — J019 Acute sinusitis, unspecified: Secondary | ICD-10-CM | POA: Diagnosis not present

## 2017-12-26 DIAGNOSIS — B9689 Other specified bacterial agents as the cause of diseases classified elsewhere: Secondary | ICD-10-CM | POA: Diagnosis not present

## 2017-12-26 DIAGNOSIS — M5030 Other cervical disc degeneration, unspecified cervical region: Secondary | ICD-10-CM | POA: Diagnosis not present

## 2017-12-26 DIAGNOSIS — R05 Cough: Secondary | ICD-10-CM | POA: Diagnosis not present

## 2018-01-25 ENCOUNTER — Other Ambulatory Visit: Payer: Self-pay | Admitting: Internal Medicine

## 2018-02-24 DIAGNOSIS — M25531 Pain in right wrist: Secondary | ICD-10-CM | POA: Diagnosis not present

## 2018-02-24 DIAGNOSIS — G5601 Carpal tunnel syndrome, right upper limb: Secondary | ICD-10-CM | POA: Diagnosis not present

## 2018-05-24 ENCOUNTER — Other Ambulatory Visit: Payer: Self-pay | Admitting: Internal Medicine

## 2018-08-26 ENCOUNTER — Other Ambulatory Visit: Payer: Self-pay | Admitting: Internal Medicine

## 2018-08-26 ENCOUNTER — Ambulatory Visit (INDEPENDENT_AMBULATORY_CARE_PROVIDER_SITE_OTHER): Payer: PPO | Admitting: Internal Medicine

## 2018-08-26 ENCOUNTER — Encounter: Payer: Self-pay | Admitting: Internal Medicine

## 2018-08-26 VITALS — BP 140/76 | HR 65 | Temp 98.1°F | Resp 15 | Ht 65.0 in | Wt 153.2 lb

## 2018-08-26 DIAGNOSIS — E782 Mixed hyperlipidemia: Secondary | ICD-10-CM

## 2018-08-26 DIAGNOSIS — R7301 Impaired fasting glucose: Secondary | ICD-10-CM

## 2018-08-26 DIAGNOSIS — Z1239 Encounter for other screening for malignant neoplasm of breast: Secondary | ICD-10-CM | POA: Diagnosis not present

## 2018-08-26 DIAGNOSIS — M25551 Pain in right hip: Secondary | ICD-10-CM

## 2018-08-26 DIAGNOSIS — K219 Gastro-esophageal reflux disease without esophagitis: Secondary | ICD-10-CM

## 2018-08-26 DIAGNOSIS — G8929 Other chronic pain: Secondary | ICD-10-CM | POA: Diagnosis not present

## 2018-08-26 DIAGNOSIS — E042 Nontoxic multinodular goiter: Secondary | ICD-10-CM

## 2018-08-26 DIAGNOSIS — Z1231 Encounter for screening mammogram for malignant neoplasm of breast: Secondary | ICD-10-CM | POA: Diagnosis not present

## 2018-08-26 DIAGNOSIS — Z515 Encounter for palliative care: Secondary | ICD-10-CM | POA: Diagnosis not present

## 2018-08-26 DIAGNOSIS — Z23 Encounter for immunization: Secondary | ICD-10-CM | POA: Diagnosis not present

## 2018-08-26 DIAGNOSIS — R635 Abnormal weight gain: Secondary | ICD-10-CM | POA: Diagnosis not present

## 2018-08-26 DIAGNOSIS — R5383 Other fatigue: Secondary | ICD-10-CM | POA: Diagnosis not present

## 2018-08-26 LAB — CBC WITH DIFFERENTIAL/PLATELET
Basophils Absolute: 0.1 10*3/uL (ref 0.0–0.1)
Basophils Relative: 0.9 % (ref 0.0–3.0)
Eosinophils Absolute: 0.2 10*3/uL (ref 0.0–0.7)
Eosinophils Relative: 2.4 % (ref 0.0–5.0)
HCT: 40.5 % (ref 36.0–46.0)
Hemoglobin: 13.6 g/dL (ref 12.0–15.0)
Lymphocytes Relative: 30 % (ref 12.0–46.0)
Lymphs Abs: 2 10*3/uL (ref 0.7–4.0)
MCHC: 33.7 g/dL (ref 30.0–36.0)
MCV: 90.8 fl (ref 78.0–100.0)
MONO ABS: 0.6 10*3/uL (ref 0.1–1.0)
Monocytes Relative: 8.3 % (ref 3.0–12.0)
NEUTROS PCT: 58.4 % (ref 43.0–77.0)
Neutro Abs: 3.9 10*3/uL (ref 1.4–7.7)
PLATELETS: 338 10*3/uL (ref 150.0–400.0)
RBC: 4.45 Mil/uL (ref 3.87–5.11)
RDW: 13.9 % (ref 11.5–15.5)
WBC: 6.7 10*3/uL (ref 4.0–10.5)

## 2018-08-26 LAB — COMPREHENSIVE METABOLIC PANEL
ALBUMIN: 4.6 g/dL (ref 3.5–5.2)
ALT: 17 U/L (ref 0–35)
AST: 22 U/L (ref 0–37)
Alkaline Phosphatase: 58 U/L (ref 39–117)
BILIRUBIN TOTAL: 0.8 mg/dL (ref 0.2–1.2)
BUN: 12 mg/dL (ref 6–23)
CHLORIDE: 105 meq/L (ref 96–112)
CO2: 30 meq/L (ref 19–32)
CREATININE: 0.76 mg/dL (ref 0.40–1.20)
Calcium: 10.6 mg/dL — ABNORMAL HIGH (ref 8.4–10.5)
GFR: 87.36 mL/min (ref >60.00)
Glucose, Bld: 96 mg/dL (ref 70–99)
Potassium: 4.3 mEq/L (ref 3.5–5.1)
SODIUM: 140 meq/L (ref 135–145)
Total Protein: 7.2 g/dL (ref 6.0–8.3)

## 2018-08-26 LAB — LIPID PANEL
Cholesterol: 172 mg/dL (ref 0–200)
HDL: 56.4 mg/dL (ref >39.00)
LDL CALC: 105 mg/dL — AB (ref 0–99)
NONHDL: 115.49
Total CHOL/HDL Ratio: 3
Triglycerides: 50 mg/dL (ref 0.0–149.0)
VLDL: 10 mg/dL (ref 0.0–40.0)

## 2018-08-26 LAB — TSH: TSH: 0.81 u[IU]/mL (ref 0.35–4.50)

## 2018-08-26 LAB — HEMOGLOBIN A1C: Hgb A1c MFr Bld: 5.5 % (ref 4.6–6.5)

## 2018-08-26 MED ORDER — ZOSTER VAC RECOMB ADJUVANTED 50 MCG/0.5ML IM SUSR: 0.5000 mL | Freq: Once | INTRAMUSCULAR | 1 refills | Status: AC

## 2018-08-26 NOTE — Progress Notes (Signed)
Patient ID: Bridget [name], female    DOB: January 07, 1931  Age: 85 y.o. MRN: [id number]  The patient is here for follow up and management of multiple  chronic and acute problems.   Last mammogram was  at age 89 in 20.   LAST ORDERED MAMMOGRAM WAS IN MARCH  2017,  But she did not have the mammogram and has not been seen since then except  for acute visits   The risk factors are reflected in the social history.  The roster of all physicians providing medical care to patient - is listed in the Snapshot section of the chart.  Activities of daily living:  The patient is 100% independent in all ADLs: dressing, toileting, feeding as well as independent mobility  Home safety : The patient has smoke detectors in the home. They wear seatbelts.  There are no firearms at home. There is no violence in the home.   There is no risks for hepatitis, STDs or HIV. There is no   history of blood transfusion. They have no travel history to infectious disease endemic areas of the world.  The patient has seen their dentist in the last six month. They have seen their eye doctor in the last year. They admit to slight hearing difficulty with regard to whispered voices and some television programs.  They have deferred audiologic testing in the last year.  They do not  have excessive sun exposure. Discussed the need for sun protection: hats, long sleeves and use of sunscreen if there is significant sun exposure.   Diet: the importance of a healthy diet is discussed. They do have a healthy diet.  The benefits of regular aerobic exercise were discussed. She walks 4 times per week ,  20 minutes.   Depression screen: there are no signs or vegative symptoms of depression- irritability, change in appetite, anhedonia, sadness/tearfullness.  Cognitive assessment: the patient manages all their financial and personal affairs and is actively engaged. They could relate day,date,year and events; recalled 2/3 objects at 3 minutes;  performed clock-face test normally.  The following portions of the patient's history were reviewed and updated as appropriate: allergies, current medications, past family history, past medical history,  past surgical history, past social history  and problem list.  Visual acuity was not assessed per patient preference since she has regular follow up with her ophthalmologist. Hearing and body mass index were assessed and reviewed.   During the course of the visit , End of Life objectives were discussed at length,  Patient does not have a living will in place or a healthcare power of attorney.  She was given printed information about advance directives and encouraged to return after discussing with her family,   During the course of the visit the patient was educated and counseled about appropriate screening and preventive services including : fall prevention , diabetes screening, nutrition counseling, colorectal cancer screening, and recommended immunizations.    CC: The primary encounter diagnosis was Breast cancer screening. Diagnoses of Weight gain, Multinodular goiter, Impaired fasting glucose, Fatigue, unspecified type, Moderate mixed hyperlipidemia not requiring statin therapy, Need for 23-polyvalent pneumococcal polysaccharide vaccine, Hip pain, chronic, right, Gastroesophageal reflux disease without esophagitis, and Breast cancer screening by mammogram were also pertinent to this visit.   Self reported breast lumps today. Left breast, present for a few weeks,  Non tender   History Bridget [name] has a past medical history of Diverticulosis of colon without diverticulitis and GERD (gastroesophageal reflux disease).   She has  a past surgical history that includes Oophorectomy; Abdominal hysterectomy; and Toe Surgery (06/2015).   Her family history includes Cancer in her brother, brother, sister, sister, and sister; Heart failure in her mother.She reports that she has never smoked. She has never used  smokeless tobacco. She reports that she does not drink alcohol or use drugs.  Outpatient Medications Prior to Visit  Medication Sig Dispense Refill  . acetaminophen (TYLENOL) 500 MG tablet Take 1,000 mg by mouth daily as needed.    . Calcium Carbonate-Vitamin D (CALCIUM + D) 600-200 MG-UNIT TABS Take 1 tablet by mouth daily.      . diclofenac sodium (VOLTAREN) 1 % GEL Apply 4 g topically 4 (four) times daily. 1 Tube 3  . Multiple Vitamins-Minerals (CENTRUM SILVER PO) Take 1 tablet by mouth daily.      . pantoprazole (PROTONIX) 40 MG tablet TAKE 1 TABLET BY MOUTH ONCE DAILY 90 tablet 0  . lidocaine (LIDODERM) 5 % Place 1 patch onto the skin daily. Remove & Discard patch within 12 hours. (Patient not taking: Reported on 08/26/2018) 30 patch 0  . meloxicam (MOBIC) 7.5 MG tablet Take 1 tablet (7.5 mg total) by mouth daily. (Patient not taking: Reported on 08/26/2018) 30 tablet 1   No facility-administered medications prior to visit.     Review of Systems   Patient denies headache, fevers, malaise, unintentional weight loss, skin rash, eye pain, sinus congestion and sinus pain, sore throat, dysphagia,  hemoptysis , cough, dyspnea, wheezing, chest pain, palpitations, orthopnea, edema, abdominal pain, nausea, melena, diarrhea, constipation, flank pain, dysuria, hematuria, urinary  Frequency, nocturia, numbness, tingling, seizures,  Focal weakness, Loss of consciousness,  Tremor, insomnia, depression, anxiety, and suicidal ideation.      Objective:  BP 140/76 (BP Location: Left Arm, Patient Position: Sitting, Cuff Size: Normal)   Pulse 65   Temp 98.1 F (36.7 C) (Oral)   Resp 15   Ht 5\' 5"  (1.651 m)   Wt 153 lb 3.2 oz (69.5 kg)   SpO2 95%   BMI 25.49 kg/m   Physical Exam   General appearance: alert, cooperative and appears stated age Head: Normocephalic, without obvious abnormality, atraumatic Eyes: conjunctivae/corneas clear. PERRL, EOM's intact. Fundi benign. Ears: normal TM's and  external ear canals both ears Nose: Nares normal. Septum midline. Mucosa normal. No drainage or sinus tenderness. Throat: lips, mucosa, and tongue normal; teeth and gums normal Neck: no adenopathy, no carotid bruit, no JVD, supple, symmetrical, trachea midline and thyroid not enlarged, symmetric, no tenderness/mass/nodules Lungs: clear to auscultation bilaterally Breasts: normal appearance, no masses appreciated , no tenderness Heart: regular rate and rhythm, S1, S2 normal, no murmur, click, rub or gallop Abdomen: soft, non-tender; bowel sounds normal; no masses,  no organomegaly Extremities: extremities normal, atraumatic, no cyanosis or edema Pulses: 2+ and symmetric Skin: Skin color, texture, turgor normal. No rashes or lesions Neurologic: Alert and oriented X 3, normal strength and tone. Normal symmetric reflexes. Normal coordination and gait.       Assessment & Plan:   Problem List Items Addressed This Visit    RESOLVED: Weight gain   Multinodular goiter    Thyroid function is WNL  Lab Results  Component Value Date   TSH 0.81 08/26/2018         Relevant Orders   TSH (Completed)   Hip pain, chronic, right    Plain films are normal.  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  Continue  prn use of Aleve      GERD (gastroesophageal reflux disease)    Symptoms are  controlled with protonoix.        Breast cancer screening by mammogram    She has not had a mammogram since age 16 but has decided to return to screening because she has felt a mass. I feel no mass on exam and have recommended a 3d mammogram        Other Visit Diagnoses    Breast cancer screening    -  Primary   Relevant Orders   MM 3D SCREEN BREAST BILATERAL   Impaired fasting glucose       Relevant Orders   Hemoglobin A1c (Completed)   Comprehensive metabolic panel (Completed)   Fatigue, unspecified type       Relevant Orders   CBC with Differential/Platelet (Completed)   Moderate  mixed hyperlipidemia not requiring statin therapy       Relevant Orders   Lipid panel (Completed)   Need for 23-polyvalent pneumococcal polysaccharide vaccine       Relevant Orders   Pneumococcal polysaccharide vaccine 23-valent greater than or equal to 2yo subcutaneous/IM (Completed)     A total of 40 minutes was spent with patient more than half of which was spent in counseling patient on the above mentioned issues , reviewing and explaining recent labs and imaging studies done, and coordination of care.  I have discontinued Roshanda G. Adcock's lidocaine and meloxicam. I am also having her start on Zoster Vaccine Adjuvanted. Additionally, I am having her maintain her Multiple Vitamins-Minerals (CENTRUM SILVER PO), Calcium Carbonate-Vitamin D, diclofenac sodium, and acetaminophen.  Meds ordered this encounter  Medications  . Zoster Vaccine Adjuvanted [facility]) injection    Sig: Inject 0.5 mLs into the muscle once for 1 dose.    Dispense:  1 each    Refill:  1    Medications Discontinued During This Encounter  Medication Reason  . lidocaine (LIDODERM) 5 % Patient has not taken in last 30 days  . meloxicam (MOBIC) 7.5 MG tablet Patient has not taken in last 30 days    Follow-up: No follow-ups on file.   Crecencio Mc, MD

## 2018-08-26 NOTE — Patient Instructions (Addendum)
I have ordered your mammogram  I am screening for you for diabetes today  You received the final pneumonia vaccine today  The ShingRx vaccine is now available  and is much more protective than the old one  Zostavax  (it is about 97%  Effective in preventing shingles). .   It is therefore RECOMMENDED  to prevent shingles so I have printed you a prescription for it.  (it requires a 2nd dose 2 to 6 months after the first one) .  It will cause you to have flu  like symptoms for 2 days  So don't do it before an important event   Health Maintenance After Age 50 After age 66, you are at a higher risk for certain long-term diseases and infections as well as injuries from falls. Falls are a major cause of broken bones and head injuries in people who are older than age 72. Getting regular preventive care can help to keep you healthy and well. Preventive care includes getting regular testing and making lifestyle changes as recommended by your health care provider. Talk with your health care provider about:  Which screenings and tests you should have. A screening is a test that checks for a disease when you have no symptoms.  A diet and exercise plan that is right for you. What should I know about screenings and tests to prevent falls? Screening and testing are the best ways to find a health problem early. Early diagnosis and treatment give you the best chance of managing medical conditions that are common after age 37. Certain conditions and lifestyle choices may make you more likely to have a fall. Your health care provider may recommend:  Regular vision checks. Poor vision and conditions such as cataracts can make you more likely to have a fall. If you wear glasses, make sure to get your prescription updated if your vision changes.  Medicine review. Work with your health care provider to regularly review all of the medicines you are taking, including over-the-counter medicines. Ask your health care  provider about any side effects that may make you more likely to have a fall. Tell your health care provider if any medicines that you take make you feel dizzy or sleepy.  Osteoporosis screening. Osteoporosis is a condition that causes the bones to get weaker. This can make the bones weak and cause them to break more easily.  Blood pressure screening. Blood pressure changes and medicines to control blood pressure can make you feel dizzy.  Strength and balance checks. Your health care provider may recommend certain tests to check your strength and balance while standing, walking, or changing positions.  Foot health exam. Foot pain and numbness, as well as not wearing proper footwear, can make you more likely to have a fall.  Depression screening. You may be more likely to have a fall if you have a fear of falling, feel emotionally low, or feel unable to do activities that you used to do.  Alcohol use screening. Using too much alcohol can affect your balance and may make you more likely to have a fall. What actions can I take to lower my risk of falls? General instructions  Talk with your health care provider about your risks for falling. Tell your health care provider if: ? You fall. Be sure to tell your health care provider about all falls, even ones that seem minor. ? You feel dizzy, sleepy, or off-balance.  Take over-the-counter and prescription medicines only as told by your  health care provider. These include any supplements.  Eat a healthy diet and maintain a healthy weight. A healthy diet includes low-fat dairy products, low-fat (lean) meats, and fiber from whole grains, beans, and lots of fruits and vegetables. Home safety  Remove any tripping hazards, such as rugs, cords, and clutter.  Install safety equipment such as grab bars in bathrooms and safety rails on stairs.  Keep rooms and walkways well-lit. Activity   Follow a regular exercise program to stay fit. This will help  you maintain your balance. Ask your health care provider what types of exercise are appropriate for you.  If you need a cane or walker, use it as recommended by your health care provider.  Wear supportive shoes that have nonskid soles. Lifestyle  Do not drink alcohol if your health care provider tells you not to drink.  If you drink alcohol, limit how much you have: ? 0-1 drink a day for women. ? 0-2 drinks a day for men.  Be aware of how much alcohol is in your drink. In the [place] one drink equals one typical bottle of beer (12 oz), one-half glass of wine (5 oz), or one shot of hard liquor (1 oz).  Do not use any products that contain nicotine or tobacco, such as cigarettes and e-cigarettes. If you need help quitting, ask your health care provider. Summary  Having a healthy lifestyle and getting preventive care can help to protect your health and wellness after age 20.  Screening and testing are the best way to find a health problem early and help you avoid having a fall. Early diagnosis and treatment give you the best chance for managing medical conditions that are more common for people who are older than age 103.  Falls are a major cause of broken bones and head injuries in people who are older than age 48. Take precautions to prevent a fall at home.  Work with your health care provider to learn what changes you can make to improve your health and wellness and to prevent falls. This information is not intended to replace advice given to you by your health care provider. Make sure you discuss any questions you have with your health care provider. Document Released: 05/20/2017 Document Revised: 05/20/2017 Document Reviewed: 05/20/2017 Elsevier Interactive Patient Education  2019 [employer].

## 2018-08-28 DIAGNOSIS — Z515 Encounter for palliative care: Secondary | ICD-10-CM | POA: Insufficient documentation

## 2018-08-28 NOTE — Assessment & Plan Note (Signed)
Symptoms are  controlled with protonoix.

## 2018-08-28 NOTE — Assessment & Plan Note (Signed)
Thyroid function is WNL  Lab Results  Component Value Date   TSH 0.81 08/26/2018

## 2018-08-28 NOTE — Assessment & Plan Note (Addendum)
Plain films are normal.  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  Continue prn use of Aleve

## 2018-08-28 NOTE — Assessment & Plan Note (Signed)
During the course of the visit , End of Life objectives were discussed at length,  Patient does not have a living will in place or a healthcare power of attorney.  She was given printed information about advance directives and encouraged to return after discussing with her family,

## 2018-08-28 NOTE — Assessment & Plan Note (Addendum)
She has not had a mammogram since age 85 but has decided to return to screening because she has felt a mass. I feel no mass on exam and have recommended a 3d mammogram

## 2018-11-02 ENCOUNTER — Ambulatory Visit: Payer: PPO

## 2018-11-19 ENCOUNTER — Other Ambulatory Visit: Payer: Self-pay | Admitting: Internal Medicine

## 2018-11-26 ENCOUNTER — Other Ambulatory Visit: Payer: Self-pay

## 2018-11-26 NOTE — Patient Outreach (Signed)
[facility]) Care Management  11/26/2018  Yaa NIYA BEHLER 1931/04/11 [id number]   Referral Date: 11/26/2018 Referral Source: Nurseline Referral Reason:Caller states that right hand and wrist has no feeling.  It feels numb.    Outreach Attempt: Telephone call to patient.  She reports that her right wrist not hand was completely numb but feels better now.  Patient has been diagnosed last year with Carpal Tunnel last year per record. Talked with patient about carpal tunnel and visit last year.  She states that she does wear her brace at night but not so much during the day.  Advised patient that she may want to wear it during the day when she is active with her hands to help and if that does not help she may want to contact the orthopedic again for further treatment. She verbalized understanding.  Patient declined any further needs at this time.    Plan: RN CM will close case at this time.   Jone Baseman, RN, MSN [facility] Care Management Care Management Coordinator Direct Line [phone] Toll Free: [phone]  Fax: [phone]

## 2019-01-03 ENCOUNTER — Ambulatory Visit
Admission: RE | Admit: 2019-01-03 | Discharge: 2019-01-03 | Disposition: A | Discharge status: Home / Self Care | Payer: PPO | Source: Ambulatory Visit | Attending: Internal Medicine | Admitting: Internal Medicine

## 2019-01-03 ENCOUNTER — Other Ambulatory Visit: Payer: Self-pay

## 2019-01-03 DIAGNOSIS — Z1231 Encounter for screening mammogram for malignant neoplasm of breast: Secondary | ICD-10-CM | POA: Diagnosis not present

## 2019-01-03 DIAGNOSIS — Z1239 Encounter for other screening for malignant neoplasm of breast: Secondary | ICD-10-CM

## 2019-01-06 ENCOUNTER — Ambulatory Visit: Payer: PPO

## 2019-02-07 ENCOUNTER — Other Ambulatory Visit: Payer: Self-pay

## 2019-02-07 ENCOUNTER — Ambulatory Visit (INDEPENDENT_AMBULATORY_CARE_PROVIDER_SITE_OTHER): Payer: PPO | Admitting: Internal Medicine

## 2019-02-07 ENCOUNTER — Encounter: Payer: Self-pay | Admitting: Internal Medicine

## 2019-02-07 VITALS — BP 120/80 | HR 66 | Temp 97.7°F | Resp 15 | Ht 65.0 in | Wt 153.8 lb

## 2019-02-07 DIAGNOSIS — L52 Erythema nodosum: Secondary | ICD-10-CM | POA: Insufficient documentation

## 2019-02-07 NOTE — Patient Instructions (Signed)
Return to [place] for a chest x ray  You will be getting your  labs done at [facility]  Dermatology referral for biopsy of the nodule on your leg    Erythema Nodosum Erythema nodosum is a skin condition in which patches of fat under the skin of the lower legs become inflamed. This causes painful bumps (nodules) to form. What are the causes? This condition may be caused by:  Infections. Strep throat (pharyngitis) is a common cause.  Certain medicines, especially birth control pills, penicillin, and sulfa medicines.  Sarcoidosis.  Pregnancy.  Certain inflammatory conditions, such as Crohn's disease.  Certain cancers. In some cases, the cause may not be known. What increases the risk? This condition is more likely to develop in young adult women. What are the signs or symptoms? The nodules from erythema nodosum:  Look like raised bruises and are tender to the touch.  Usually appear on the front of the lower legs (shins) and may also appear on the arms or the abdomen.  Gradually change in color from pink to brown.  Leave a dark mark that fades away after several months. Other symptoms besides nodules may include:  Fever.  Fatigue.  Joint pain. How is this diagnosed? This condition is often diagnosed based on your symptoms. You may have a physical exam, X-rays, and blood tests to help find the cause of your erythema nodosum. A skin sample may be removed (skin biopsy) to be examined by a specialist (pathologist). How is this treated? Treatment for this condition depends on the cause. The nodules usually go away after the underlying cause is treated, such as after medicine is used to fight infection. Treatment may also include:  NSAIDs for pain and inflammation.  Potassium iodide supplements.  Steroid medicines.  Rest. Follow these instructions at home: Activity  Rest and return to your normal activities as told by your health care provider. Ask your health care provider  what activities are safe for you.  Avoid very intense (vigorous) exercise until your symptoms go away. General instructions   Take over-the-counter and prescription medicines only as told by your health care provider. If you were prescribed antibiotic medicine, take or apply it as told by your health care provider. Do not stop using the antibiotic even if you start to feel better  If directed, put ice on affected areas to help relieve pain or inflammation: ? Put ice in a plastic bag. ? Place a towel between your skin and the bag. ? Apply ice 2-3 times a day. Do not apply ice for more than 20 minutes at a time.  If possible, raise (elevate) the affected area above the level of your heart when you are sitting or lying down. This may help with pain and inflammation.  Avoid scratching your skin.  To relieve itchiness, make a paste with dry oatmeal and warm water, then put the paste on itchy areas. Let the paste dry, remove it, and then apply moisturizer. You may do this 2-3 times a day, or as needed.  Keep all follow-up visits as told by your health care provider. This is important. Contact a health care provider if you:  Have symptoms that do not get better with treatment and home care.  Have a fever that does not go away. Get help right away if you:  Have pain that gets worse.  Have a sore throat.  Vomit more than one time. Summary  Erythema nodosum is a skin condition that causes painful bumps (nodules)  to form.  The bumps usually appear on the front of the lower legs (shins).  Avoid very intense (vigorous) exercise until your symptoms go away.  Contact a health care provider if you have a fever or if your symptoms do not improve. This information is not intended to replace advice given to you by your health care provider. Make sure you discuss any questions you have with your health care provider. Document Released: 08/14/2004 Document Revised: 06/19/2017 Document Reviewed:  05/12/2017 Elsevier Patient Education  2020 [employer].

## 2019-02-07 NOTE — Progress Notes (Signed)
Subjective:  Patient ID: Bridget [name], female    DOB: [birth year]  Age: 85 y.o. MRN: [id number]  CC: The encounter diagnosis was Erythema nodosum.  HPI Syndey WHITTNEY [name] presents for evaluation of a tender subcutanous nodule that appeared on her left shin about two months ago  The nodule initially appeared red in color,  But has since faded to dark brown and is tender to palpation.  She  Denies and concurrent history of fever,  Sore throat, shortness of breath or body aches.  No history of TB, sarcoid.   She does note lower extremity edema, non pitting,  That is worse  by the end of the day and has tried wearing compression knee highs but has considerable difficulty getting them on.   The patient has no signs or symptoms of COVID 19 infection (fever, cough, sore throat  or shortness of breath beyond what is typical for patient).  Patient denies contact with other persons with the above mentioned symptoms or with anyone confirmed to have COVID 19   Outpatient Medications Prior to Visit  Medication Sig Dispense Refill   acetaminophen (TYLENOL) 500 MG tablet Take 1,000 mg by mouth daily as needed.     Calcium Carbonate-Vitamin D (CALCIUM + D) 600-200 MG-UNIT TABS Take 1 tablet by mouth daily.       diclofenac sodium (VOLTAREN) 1 % GEL Apply 4 g topically 4 (four) times daily. 1 Tube 3   Multiple Vitamins-Minerals (CENTRUM SILVER PO) Take 1 tablet by mouth daily.       pantoprazole (PROTONIX) 40 MG tablet TAKE 1 TABLET BY MOUTH ONCE DAILY 90 tablet 0   No facility-administered medications prior to visit.     Review of Systems;  Patient denies headache, fevers, malaise, unintentional weight loss, skin rash, eye pain, sinus congestion and sinus pain, sore throat, dysphagia,  hemoptysis , cough, dyspnea, wheezing, chest pain, palpitations, orthopnea, edema, abdominal pain, nausea, melena, diarrhea, constipation, flank pain, dysuria, hematuria, urinary  Frequency, nocturia, numbness, tingling,  seizures,  Focal weakness, Loss of consciousness,  Tremor, insomnia, depression, anxiety, and suicidal ideation.      Objective:  BP 120/80 (BP Location: Left Arm, Patient Position: Sitting, Cuff Size: Normal)    Pulse 66    Temp 97.7 F (36.5 C) (Oral)    Resp 15    Ht 5\' 5"  (1.651 m)    Wt 153 lb 12.8 oz (69.8 kg)    SpO2 97%    BMI 25.59 kg/m   BP Readings from Last 3 Encounters:  02/07/19 120/80  08/26/18 140/76  10/29/17 130/70    Wt Readings from Last 3 Encounters:  02/07/19 153 lb 12.8 oz (69.8 kg)  08/26/18 153 lb 3.2 oz (69.5 kg)  10/29/17 155 lb 12.8 oz (70.7 kg)    General appearance: alert, cooperative and appears stated age Ears: normal TM's and external ear canals both ears Throat: lips, mucosa, and tongue normal; teeth and gums normal Neck: no adenopathy, no carotid bruit, supple, symmetrical, trachea midline and thyroid not enlarged, symmetric, no tenderness/mass/nodules Back: symmetric, no curvature. ROM normal. No CVA tenderness. Lungs: clear to auscultation bilaterally Heart: regular rate and rhythm, S1, S2 normal, no murmur, click, rub or gallop Abdomen: soft, non-tender; bowel sounds normal; no masses,  no organomegaly Pulses: 2+ and symmetric Skin: bilateral hyperpigment subcutaneous nodules on anterior shins , some varicose veins distal to nodules,  No venous lakes ,  Mild/early  brawny skin changes distal to nodules as well. Lymph  nodes: Cervical, supraclavicular, and axillary nodes normal.  Lab Results  Component Value Date   HGBA1C 5.5 08/26/2018    Lab Results  Component Value Date   CREATININE 0.76 08/26/2018   CREATININE 0.71 09/26/2017   CREATININE 0.71 09/25/2017    Lab Results  Component Value Date   WBC 6.7 08/26/2018   HGB 13.6 08/26/2018   HCT 40.5 08/26/2018   PLT 338.0 08/26/2018   GLUCOSE 96 08/26/2018   CHOL 172 08/26/2018   TRIG 50.0 08/26/2018   HDL 56.40 08/26/2018   LDLCALC 105 (H) 08/26/2018   ALT 17 08/26/2018   AST  22 08/26/2018   NA 140 08/26/2018   K 4.3 08/26/2018   CL 105 08/26/2018   CREATININE 0.76 08/26/2018   BUN 12 08/26/2018   CO2 30 08/26/2018   TSH 0.81 08/26/2018   HGBA1C 5.5 08/26/2018   MICROALBUR 1.2 10/12/2015    Mm 3d Screen Breast Bilateral  Result Date: 01/03/2019 CLINICAL DATA:  Screening. EXAM: DIGITAL SCREENING BILATERAL MAMMOGRAM WITH TOMO AND CAD COMPARISON:  Previous exam(s). ACR Breast Density Category c: The breast tissue is heterogeneously dense, which may obscure small masses. FINDINGS: There are no findings suspicious for malignancy. Images were processed with CAD. IMPRESSION: No mammographic evidence of malignancy. A result letter of this screening mammogram will be mailed directly to the patient. RECOMMENDATION: Screening mammogram in one year. (Code:SM-B-01Y) BI-RADS CATEGORY  1: Negative. Electronically Signed   By: Dorise Bullion III M.D   On: 01/03/2019 15:45    Assessment & Plan:   Problem List Items Addressed This Visit      Unprioritized   Erythema nodosum - Primary    Etiology unclear will screen for TB, Sarcoid, strep throat and prior COVID  19 infection.  all labs ordered through [place] to be done at [facility] .  Screening  chest xray to be done here in office,  Dermatology referral for skin biopsy recommended and in progress.        Relevant Orders   SAR CoV2 Serology (COVID 19)AB(IGG)IA   DG Chest 2 View   CBC with Differential/Platelet   Comprehensive metabolic panel   QuantiFERON-TB Gold Plus   Culture, Group A Strep   Ambulatory referral to Dermatology      I am having Braylea G. Choung maintain her Multiple Vitamins-Minerals (CENTRUM SILVER PO), Calcium Carbonate-Vitamin D, diclofenac sodium, acetaminophen, and pantoprazole.  No orders of the defined types were placed in this encounter.   There are no discontinued medications.  Follow-up: No follow-ups on file.   Crecencio Mc, MD

## 2019-02-08 DIAGNOSIS — L52 Erythema nodosum: Secondary | ICD-10-CM | POA: Diagnosis not present

## 2019-02-08 NOTE — Assessment & Plan Note (Signed)
Etiology unclear will screen for TB, Sarcoid, strep throat and prior COVID  19 infection.  all labs ordered through [place] to be done at [facility] .  Screening  chest xray to be done here in office,  Dermatology referral for skin biopsy recommended and in progress.

## 2019-02-09 ENCOUNTER — Other Ambulatory Visit: Payer: Self-pay

## 2019-02-09 ENCOUNTER — Other Ambulatory Visit: Payer: PPO

## 2019-02-09 ENCOUNTER — Ambulatory Visit (INDEPENDENT_AMBULATORY_CARE_PROVIDER_SITE_OTHER): Payer: PPO

## 2019-02-09 ENCOUNTER — Other Ambulatory Visit (INDEPENDENT_AMBULATORY_CARE_PROVIDER_SITE_OTHER): Payer: PPO

## 2019-02-09 DIAGNOSIS — L52 Erythema nodosum: Secondary | ICD-10-CM | POA: Diagnosis not present

## 2019-02-09 LAB — STREP COMPLETE PANEL
Strep Pyogenes: NEGATIVE
Strep dysgalactiae: NEGATIVE

## 2019-02-10 LAB — COMPREHENSIVE METABOLIC PANEL
ALT: 13 IU/L (ref 0–32)
AST: 20 IU/L (ref 0–40)
Albumin/Globulin Ratio: 2.3 — ABNORMAL HIGH (ref 1.2–2.2)
Albumin: 4.6 g/dL (ref 3.6–4.6)
Alkaline Phosphatase: 62 IU/L (ref 39–117)
BUN/Creatinine Ratio: 11 — ABNORMAL LOW (ref 12–28)
BUN: 9 mg/dL (ref 8–27)
Bilirubin Total: 0.5 mg/dL (ref 0.0–1.2)
CO2: 24 mmol/L (ref 20–29)
Calcium: 10.5 mg/dL — ABNORMAL HIGH (ref 8.7–10.3)
Chloride: 104 mmol/L (ref 96–106)
Creatinine, Ser: 0.83 mg/dL (ref 0.57–1.00)
GFR calc Af Amer: 74 mL/min/{1.73_m2} (ref >59)
GFR calc non Af Amer: 64 mL/min/{1.73_m2} (ref >59)
Globulin, Total: 2 g/dL (ref 1.5–4.5)
Glucose: 90 mg/dL (ref 65–99)
Potassium: 4.5 mmol/L (ref 3.5–5.2)
Sodium: 144 mmol/L (ref 134–144)
Total Protein: 6.6 g/dL (ref 6.0–8.5)

## 2019-02-10 LAB — CBC WITH DIFFERENTIAL/PLATELET
Basophils Absolute: 0 10*3/uL (ref 0.0–0.2)
Basos: 1 %
EOS (ABSOLUTE): 0.1 10*3/uL (ref 0.0–0.4)
Eos: 2 %
Hematocrit: 37.7 % (ref 34.0–46.6)
Hemoglobin: 12.7 g/dL (ref 11.1–15.9)
Immature Grans (Abs): 0 10*3/uL (ref 0.0–0.1)
Immature Granulocytes: 0 %
Lymphocytes Absolute: 1.6 10*3/uL (ref 0.7–3.1)
Lymphs: 28 %
MCH: 30.3 pg (ref 26.6–33.0)
MCHC: 33.7 g/dL (ref 31.5–35.7)
MCV: 90 fL (ref 79–97)
Monocytes Absolute: 0.5 10*3/uL (ref 0.1–0.9)
Monocytes: 8 %
Neutrophils Absolute: 3.5 10*3/uL (ref 1.4–7.0)
Neutrophils: 61 %
Platelets: 327 10*3/uL (ref 150–450)
RBC: 4.19 x10E6/uL (ref 3.77–5.28)
RDW: 12.5 % (ref 11.7–15.4)
WBC: 5.7 10*3/uL (ref 3.4–10.8)

## 2019-02-10 LAB — QUANTIFERON-TB GOLD PLUS
QuantiFERON Mitogen Value: 0.84 IU/mL
QuantiFERON Nil Value: 0.02 IU/mL
QuantiFERON TB1 Ag Value: 0.02 IU/mL
QuantiFERON TB2 Ag Value: 0.02 IU/mL
QuantiFERON-TB Gold Plus: NEGATIVE

## 2019-02-23 ENCOUNTER — Other Ambulatory Visit: Payer: Self-pay | Admitting: Internal Medicine

## 2019-02-28 DIAGNOSIS — L918 Other hypertrophic disorders of the skin: Secondary | ICD-10-CM | POA: Diagnosis not present

## 2019-02-28 DIAGNOSIS — I8002 Phlebitis and thrombophlebitis of superficial vessels of left lower extremity: Secondary | ICD-10-CM | POA: Diagnosis not present

## 2019-02-28 DIAGNOSIS — D2371 Other benign neoplasm of skin of right lower limb, including hip: Secondary | ICD-10-CM | POA: Diagnosis not present

## 2019-03-30 DIAGNOSIS — B029 Zoster without complications: Secondary | ICD-10-CM | POA: Diagnosis not present

## 2019-04-28 ENCOUNTER — Other Ambulatory Visit: Payer: Self-pay | Admitting: Internal Medicine

## 2019-04-28 DIAGNOSIS — B029 Zoster without complications: Secondary | ICD-10-CM

## 2019-04-28 MED ORDER — GABAPENTIN 100 MG PO CAPS: 100.0000 mg | ORAL_CAPSULE | Freq: Three times a day (TID) | ORAL | 0 refills | Status: DC

## 2019-04-28 MED ORDER — VALACYCLOVIR HCL 1 G PO TABS: 1000.0000 mg | ORAL_TABLET | Freq: Three times a day (TID) | ORAL | 0 refills | Status: DC

## 2019-04-28 NOTE — Progress Notes (Signed)
Patient walkin complaint of burning pain to right arm X 1 month . Patient was DX with Shingles dr.Lykins on 03/30/19,. There is still notable rash on right arm under skin no blistering. Vital signs taken pain rated at 5 on 0-10 scale, BP 130/80, pulse 80, 02 sats at 98% room air. T 98.6 oral. Advised PCP of the above information was given verbal order to repeat Vatrex 1000 mg TID x 7 days and gabapentin 100 mg TID x 30 =10 days no refills . Advised patient to return call to office any worsening symptoms. Patient did advise she called this office on 9/8 or 9/33for appointment with PCP with complaint of possible shingles no documentation in chart.

## 2019-05-11 DIAGNOSIS — Z961 Presence of intraocular lens: Secondary | ICD-10-CM | POA: Diagnosis not present

## 2019-05-25 ENCOUNTER — Other Ambulatory Visit: Payer: Self-pay | Admitting: Internal Medicine

## 2019-11-21 ENCOUNTER — Ambulatory Visit (INDEPENDENT_AMBULATORY_CARE_PROVIDER_SITE_OTHER): Payer: PPO | Admitting: Internal Medicine

## 2019-11-21 ENCOUNTER — Other Ambulatory Visit: Payer: Self-pay

## 2019-11-21 ENCOUNTER — Encounter: Payer: Self-pay | Admitting: Internal Medicine

## 2019-11-21 VITALS — BP 120/78 | HR 84 | Temp 97.5°F | Resp 14 | Ht 65.0 in | Wt 145.2 lb

## 2019-11-21 DIAGNOSIS — E042 Nontoxic multinodular goiter: Secondary | ICD-10-CM

## 2019-11-21 DIAGNOSIS — R5383 Other fatigue: Secondary | ICD-10-CM

## 2019-11-21 DIAGNOSIS — F4321 Adjustment disorder with depressed mood: Secondary | ICD-10-CM | POA: Diagnosis not present

## 2019-11-21 DIAGNOSIS — F4320 Adjustment disorder, unspecified: Secondary | ICD-10-CM | POA: Insufficient documentation

## 2019-11-21 DIAGNOSIS — K5900 Constipation, unspecified: Secondary | ICD-10-CM | POA: Diagnosis not present

## 2019-11-21 LAB — COMPREHENSIVE METABOLIC PANEL
ALT: 10 U/L (ref 0–35)
AST: 16 U/L (ref 0–37)
Albumin: 4.4 g/dL (ref 3.5–5.2)
Alkaline Phosphatase: 56 U/L (ref 39–117)
BUN: 14 mg/dL (ref 6–23)
CO2: 29 mEq/L (ref 19–32)
Calcium: 10.5 mg/dL (ref 8.4–10.5)
Chloride: 105 mEq/L (ref 96–112)
Creatinine, Ser: 0.8 mg/dL (ref 0.40–1.20)
GFR: 82.1 mL/min (ref >60.00)
Glucose, Bld: 105 mg/dL — ABNORMAL HIGH (ref 70–99)
Potassium: 4.1 mEq/L (ref 3.5–5.1)
Sodium: 139 mEq/L (ref 135–145)
Total Bilirubin: 0.6 mg/dL (ref 0.2–1.2)
Total Protein: 6.6 g/dL (ref 6.0–8.3)

## 2019-11-21 LAB — CBC WITH DIFFERENTIAL/PLATELET
Basophils Absolute: 0 10*3/uL (ref 0.0–0.1)
Basophils Relative: 0.6 % (ref 0.0–3.0)
Eosinophils Absolute: 0.1 10*3/uL (ref 0.0–0.7)
Eosinophils Relative: 1.6 % (ref 0.0–5.0)
HCT: 38.3 % (ref 36.0–46.0)
Hemoglobin: 13 g/dL (ref 12.0–15.0)
Lymphocytes Relative: 25.6 % (ref 12.0–46.0)
Lymphs Abs: 1.6 10*3/uL (ref 0.7–4.0)
MCHC: 34 g/dL (ref 30.0–36.0)
MCV: 92.5 fl (ref 78.0–100.0)
Monocytes Absolute: 0.5 10*3/uL (ref 0.1–1.0)
Monocytes Relative: 7.9 % (ref 3.0–12.0)
Neutro Abs: 3.9 10*3/uL (ref 1.4–7.7)
Neutrophils Relative %: 64.3 % (ref 43.0–77.0)
Platelets: 290 10*3/uL (ref 150.0–400.0)
RBC: 4.13 Mil/uL (ref 3.87–5.11)
RDW: 13.8 % (ref 11.5–15.5)
WBC: 6.1 10*3/uL (ref 4.0–10.5)

## 2019-11-21 LAB — TSH: TSH: 0.91 u[IU]/mL (ref 0.35–4.50)

## 2019-11-21 NOTE — Progress Notes (Signed)
Patient ID: Bridget [name], female    DOB: 1930/09/22  Age: 86 y.o. MRN: [id number]  The patient is here for annual comprehensive  examination and management of other chronic and acute problems.   CPE postponed for one month due to patient's status of grieving loss of husband of 75 yrs.   GRIEF:  Husband died in Hospice on 10/25/22 after a brief hospitalization .      Diet: the importance of a healthy diet is discussed. She has lost 8 lbs since his death due to anorexia t.  The benefits of regular aerobic exercise were discussed. She is not walking or exercising currently .     CC: The primary encounter diagnosis was Multinodular goiter. Diagnoses of Constipation, unspecified constipation type, Fatigue, unspecified type, and Grief reaction were also pertinent to this visit.   Grief:  Patient lost her husband of 36 years on 10/25/2022 to presumed pancreatic cancer .  He had been hospitalized briefly for dehydration secondary to dysphagia , likely from his January 2021 CVA ,  And declined any further workup of an enlarging pancreatic mass.    She is having a difficult time coping with his death due to unresolved feelings of guilt. "I did the best I can."  She is facing family members who feel that Bridget [name] "gave up," and feels she should have pushed him to go to [facility] , which he refused to pursue .She has been sleeping ok,  Doing housework to stay busy.  No appetite.  Crying often.  Feels guilty/responsible for his death .  Reviewed husband's last year of life,  His hospitalizations, and his decisions about feeding tubes,  Continued workup, etc.   Invited her to share her/his spiritual beliefs,  Discussed the positive aspects of his dying , given the likelihood  of his pancreatic cancer becoming symptomatic if he had accepted a feeding tube    History Bridget [name] has a past medical history of Diverticulosis of colon without diverticulitis and GERD (gastroesophageal reflux disease).   She has a past  surgical history that includes Oophorectomy; Abdominal hysterectomy; and Toe Surgery (06/2015).   Her family history includes Cancer in her brother, brother, sister, sister, and sister; Heart failure in her mother.She reports that she has never smoked. She has never used smokeless tobacco. She reports that she does not drink alcohol or use drugs.  Outpatient Medications Prior to Visit  Medication Sig Dispense Refill  . acetaminophen (TYLENOL) 500 MG tablet Take 1,000 mg by mouth daily as needed.    . Calcium Carbonate-Vitamin D (CALCIUM + D) 600-200 MG-UNIT TABS Take 1 tablet by mouth daily.      . Multiple Vitamins-Minerals (CENTRUM SILVER PO) Take 1 tablet by mouth daily.      . pantoprazole (PROTONIX) 40 MG tablet TAKE 1 TABLET BY MOUTH ONCE DAILY 90 tablet 1  . diclofenac sodium (VOLTAREN) 1 % GEL Apply 4 g topically 4 (four) times daily. (Patient not taking: Reported on 11/21/2019) 1 Tube 3  . gabapentin (NEURONTIN) 100 MG capsule Take 1 capsule (100 mg total) by mouth 3 (three) times daily. (Patient not taking: Reported on 11/21/2019) 30 capsule 0  . valACYclovir (VALTREX) 1000 MG tablet Take 1 tablet (1,000 mg total) by mouth 3 (three) times daily. (Patient not taking: Reported on 11/21/2019) 21 tablet 0   No facility-administered medications prior to visit.    Review of Systems   Patient denies headache, fevers, malaise,  skin rash, eye pain, sinus congestion and  sinus pain, sore throat, dysphagia,  hemoptysis , cough, dyspnea, wheezing, chest pain, palpitations, orthopnea, edema, abdominal pain, nausea, melena, diarrhea, constipation, flank pain, dysuria, hematuria, urinary  Frequency, nocturia, numbness, tingling, seizures,  Focal weakness, Loss of consciousness,  Tremor, insomnia, depression, anxiety, and suicidal ideation.      Objective:  BP 120/78 (BP Location: Left Arm, Patient Position: Sitting, Cuff Size: Normal)   Pulse 84   Temp (!) 97.5 F (36.4 C) (Temporal)   Resp 14   Ht  5\' 5"  (1.651 m)   Wt 145 lb 3.2 oz (65.9 kg)   SpO2 98%   BMI 24.16 kg/m   Physical Exam  General appearance: alert, grieving, cooperative and appears stated age Ears: normal TM's and external ear canals both ears Throat: lips, mucosa, and tongue normal; teeth and gums normal Neck: no adenopathy, no carotid bruit, supple, symmetrical, trachea midline and thyroid not enlarged, symmetric, no tenderness/mass/nodules Back: symmetric, no curvature. ROM normal. No CVA tenderness. Lungs: clear to auscultation bilaterally Heart: regular rate and rhythm, S1, S2 normal, no murmur, click, rub or gallop Abdomen: soft, non-tender; bowel sounds normal; no masses,  no organomegaly Pulses: 2+ and symmetric Skin: Skin color, texture, turgor normal. No rashes or lesions Lymph nodes: Cervical, supraclavicular, and axillary nodes normal.   Assessment & Plan:   Problem List Items Addressed This Visit      Unprioritized   Constipation   Grief reaction    Patient is dealing with the unexpected loss of spouse and has adequate coping skills and emotional support .  i have asked patient to return in one month to examine for signs of unresolving grief. A total of 40 minutes was spent with patient more than half of which was spent in counseling patient on the above mentioned issues , reviewing and explaining husband's recent hospitalizations  and imaging studies done, and coordination of care.        Multinodular goiter - Primary   Relevant Orders   TSH    Other Visit Diagnoses    Fatigue, unspecified type       Relevant Orders   Comprehensive metabolic panel   CBC with Differential/Platelet     I provided  39 minutes of  face-to-face time during this encounter reviewing patient's current problems and past surgeries, labs and imaging studies, providing counseling on the above mentioned problems , and coordination  of care .  I have discontinued Bridget [name]'s diclofenac sodium, valACYclovir,  and gabapentin. I am also having her maintain her Multiple Vitamins-Minerals (CENTRUM SILVER PO), Calcium Carbonate-Vitamin D, acetaminophen, and pantoprazole.  No orders of the defined types were placed in this encounter.   Medications Discontinued During This Encounter  Medication Reason  . valACYclovir (VALTREX) 1000 MG tablet Patient has not taken in last 30 days  . gabapentin (NEURONTIN) 100 MG capsule Patient has not taken in last 30 days  . diclofenac sodium (VOLTAREN) 1 % GEL Patient has not taken in last 30 days    Follow-up: Return in about 4 weeks (around 12/19/2019).   Crecencio Mc, MD

## 2019-11-21 NOTE — Patient Instructions (Signed)
I WILL SEE YOU IN A MONTH,  SOONER IF YOU NEED ME   Do not worry ;  You and Bridget [name] made all the right choices;  He is in [facility] Lord's arms now

## 2019-11-21 NOTE — Assessment & Plan Note (Signed)
Patient is dealing with the unexpected loss of spouse and has adequate coping skills and emotional support .  i have asked patient to return in one month to examine for signs of unresolving grief. A total of 40 minutes was spent with patient more than half of which was spent in counseling patient on the above mentioned issues , reviewing and explaining husband's recent hospitalizations  and imaging studies done, and coordination of care.

## 2019-12-01 ENCOUNTER — Other Ambulatory Visit: Payer: Self-pay | Admitting: Internal Medicine

## 2019-12-01 DIAGNOSIS — Z1231 Encounter for screening mammogram for malignant neoplasm of breast: Secondary | ICD-10-CM

## 2019-12-22 ENCOUNTER — Other Ambulatory Visit: Payer: Self-pay

## 2019-12-22 ENCOUNTER — Ambulatory Visit (INDEPENDENT_AMBULATORY_CARE_PROVIDER_SITE_OTHER): Payer: PPO | Admitting: Internal Medicine

## 2019-12-22 ENCOUNTER — Encounter: Payer: Self-pay | Admitting: Internal Medicine

## 2019-12-22 VITALS — BP 120/68 | HR 67 | Temp 97.1°F | Resp 15 | Ht 65.0 in | Wt 144.6 lb

## 2019-12-22 DIAGNOSIS — Z1231 Encounter for screening mammogram for malignant neoplasm of breast: Secondary | ICD-10-CM | POA: Diagnosis not present

## 2019-12-22 DIAGNOSIS — N3941 Urge incontinence: Secondary | ICD-10-CM

## 2019-12-22 DIAGNOSIS — M85832 Other specified disorders of bone density and structure, left forearm: Secondary | ICD-10-CM | POA: Diagnosis not present

## 2019-12-22 DIAGNOSIS — E042 Nontoxic multinodular goiter: Secondary | ICD-10-CM

## 2019-12-22 DIAGNOSIS — Z0000 Encounter for general adult medical examination without abnormal findings: Secondary | ICD-10-CM

## 2019-12-22 DIAGNOSIS — F4321 Adjustment disorder with depressed mood: Secondary | ICD-10-CM | POA: Diagnosis not present

## 2019-12-22 DIAGNOSIS — Z780 Asymptomatic menopausal state: Secondary | ICD-10-CM

## 2019-12-22 MED ORDER — ZOSTER VAC RECOMB ADJUVANTED 50 MCG/0.5ML IM SUSR: 0.5000 mL | Freq: Once | INTRAMUSCULAR | 1 refills | Status: AC

## 2019-12-22 MED ORDER — PANTOPRAZOLE SODIUM 40 MG PO TBEC: 40.0000 mg | DELAYED_RELEASE_TABLET | Freq: Every day | ORAL | 1 refills | Status: DC

## 2019-12-22 MED ORDER — MOMETASONE FUROATE 0.1 % EX CREA: 1.0000 "application " | TOPICAL_CREAM | Freq: Every day | CUTANEOUS | 0 refills | Status: DC

## 2019-12-22 NOTE — Progress Notes (Signed)
Patient ID: Bridget [name], female    DOB: Nov 22, 1930  Age: 86 y.o. MRN: [id number]  The patient is here for annual comprehensive examination and management of other chronic and acute problems.   The risk factors are reflected in the social history.  The roster of all physicians providing medical care to patient - is listed in the Snapshot section of the chart.  Activities of daily living:  The patient is 100% independent in all ADLs: dressing, toileting, feeding as well as independent mobility  Home safety : The patient has smoke detectors in the home. They wear seatbelts.  There are no firearms at home. There is no violence in the home.   There is no risks for hepatitis, STDs or HIV. There is no   history of blood transfusion. They have no travel history to infectious disease endemic areas of the world.  The patient has seen their dentist in the last six month. They have seen their eye doctor in the last year. They admit to slight hearing difficulty with regard to whispered voices and some television programs.  They have deferred audiologic testing in the last year.  They do not  have excessive sun exposure. Discussed the need for sun protection: hats, long sleeves and use of sunscreen if there is significant sun exposure.   Diet: the importance of a healthy diet is discussed. They do have a healthy diet.  The benefits of regular aerobic exercise were discussed. She walks 4 times per week ,  20 minutes.   Depression screen: there are no signs or vegative symptoms of depression- irritability, change in appetite, anhedonia, sadness/tearfullness.  Cognitive assessment: the patient manages all their financial and personal affairs and is actively engaged. They could relate day,date,year and events; recalled 2/3 objects at 3 minutes; performed clock-face test normally.  The following portions of the patient's history were reviewed and updated as appropriate: allergies, current medications, past  family history, past medical history,  past surgical history, past social history  and problem list.  Visual acuity was not assessed per patient preference since she has regular follow up with her ophthalmologist. Hearing and body mass index were assessed and reviewed.   During the course of the visit the patient was educated and counseled about appropriate screening and preventive services including : fall prevention , diabetes screening, nutrition counseling, colorectal cancer screening, and recommended immunizations.    CC: The primary encounter diagnosis was Postmenopausal estrogen deficiency. Diagnoses of Breast cancer screening by mammogram, Grief reaction, Multinodular goiter, and Osteopenia of left forearm were also pertinent to this visit.  Grief : husband [name] died 03-Nov-2022 .  Has lost   1lb since last visit .  She is bothered by unresolved questions about his health.   Starting to socialize with old friends.   Urinary incontinence.  Present for years,  No change.  Discussed medications available vs PT   History Lateefa has a past medical history of Diverticulosis of colon without diverticulitis and GERD (gastroesophageal reflux disease).   She has a past surgical history that includes Oophorectomy; Abdominal hysterectomy; and Toe Surgery (06/2015).   Her family history includes Cancer in her brother, brother, sister, sister, and sister; Heart failure in her mother.She reports that she has never smoked. She has never used smokeless tobacco. She reports that she does not drink alcohol or use drugs.  Outpatient Medications Prior to Visit  Medication Sig Dispense Refill  . acetaminophen (TYLENOL) 500 MG tablet Take 1,000 mg by mouth  daily as needed.    . Calcium Carbonate-Vitamin D (CALCIUM + D) 600-200 MG-UNIT TABS Take 1 tablet by mouth daily.      . Multiple Vitamins-Minerals (CENTRUM SILVER PO) Take 1 tablet by mouth daily.      . pantoprazole (PROTONIX) 40 MG tablet TAKE 1 TABLET  BY MOUTH ONCE DAILY 90 tablet 1   No facility-administered medications prior to visit.    Review of Systems   Patient denies headache, fevers, malaise, unintentional weight loss, skin rash, eye pain, sinus congestion and sinus pain, sore throat, dysphagia,  hemoptysis , cough, dyspnea, wheezing, chest pain, palpitations, orthopnea, edema, abdominal pain, nausea, melena, diarrhea, constipation, flank pain, dysuria, hematuria, urinary  Frequency, nocturia, numbness, tingling, seizures,  Focal weakness, Loss of consciousness,  Tremor, insomnia, depression, anxiety, and suicidal ideation.     Objective:  BP 120/68 (BP Location: Left Arm, Patient Position: Sitting, Cuff Size: Normal)   Pulse 67   Temp (!) 97.1 F (36.2 C) (Temporal)   Resp 15   Ht 5\' 5"  (1.651 m)   Wt 144 lb 9.6 oz (65.6 kg)   SpO2 96%   BMI 24.06 kg/m   Physical Exam   General appearance: alert, cooperative and appears stated age Ears: normal TM's and external ear canals both ears Throat: lips, mucosa, and tongue normal; teeth and gums normal Neck: no adenopathy, no carotid bruit, supple, symmetrical, trachea midline and thyroid not enlarged, symmetric, no tenderness/mass/nodules Back: symmetric, no curvature. ROM normal. No CVA tenderness. Lungs: clear to auscultation bilaterally Heart: regular rate and rhythm, S1, S2 normal, no murmur, click, rub or gallop Abdomen: soft, non-tender; bowel sounds normal; no masses,  no organomegaly Pulses: 2+ and symmetric Skin: Skin color, texture, turgor normal. No rashes or lesions Lymph nodes: Cervical, supraclavicular, and axillary nodes normal.    Assessment & Plan:   Problem List Items Addressed This Visit      Unprioritized   Breast cancer screening by mammogram    She has resumed annual mammograms after perceiving a mass last year on self exam..  Last year's exam and  mammogram was normal.       Grief reaction     patient is coping well with the loss of her  husband and has no signs of unresolving grief.       Multinodular goiter    S/p Eondocrinlogy evaluation.  thyroid nodule biopsy negative for malignancy.  Thyroid function is normal    Lab Results  Component Value Date   TSH 0.91 11/21/2019         Osteopenia    Last DEXA was reportedly in 2013.  T scores reportedly -1.1 at forearm.   Repeat DEXA ordered.        Other Visit Diagnoses    Postmenopausal estrogen deficiency    -  Primary   Relevant Orders   DG Bone Density      I have changed Briony G. Intriago's pantoprazole. I am also having her start on mometasone and Zoster Vaccine Adjuvanted. Additionally, I am having her maintain her Multiple Vitamins-Minerals (CENTRUM SILVER PO), Calcium Carbonate-Vitamin D, and acetaminophen.  Meds ordered this encounter  Medications  . mometasone (ELOCON) 0.1 % cream    Sig: Apply 1 application topically daily.    Dispense:  45 g    Refill:  0  . pantoprazole (PROTONIX) 40 MG tablet    Sig: Take 1 tablet (40 mg total) by mouth daily.    Dispense:  90 tablet  Refill:  1  . Zoster Vaccine Adjuvanted [facility]) injection    Sig: Inject 0.5 mLs into the muscle once for 1 dose.    Dispense:  1 each    Refill:  1    Medications Discontinued During This Encounter  Medication Reason  . pantoprazole (PROTONIX) 40 MG tablet Reorder    Follow-up: No follow-ups on file.   Crecencio Mc, MD

## 2019-12-22 NOTE — Patient Instructions (Addendum)
The ShingRx vaccine is now available in local pharmacies and is much more protective than the old one  Zostavax  (it is about 97%  Effective in preventing shingles). .   It is therefore ADVISED for all interested adults over 50 to prevent shingles so I have printed you a prescription for it.  (it requires a 2nd dose 2 too 6 months after the first one) .  It will cause you to have flu  like symptoms for 2 days   Kegel Exercises  Kegel exercises can help strengthen your pelvic floor muscles. The pelvic floor is a group of muscles that support your rectum, small intestine, and bladder. In females, pelvic floor muscles also help support the womb (uterus). These muscles help you control the flow of urine and stool. Kegel exercises are painless and simple, and they do not require any equipment. Your provider may suggest Kegel exercises to:  Improve bladder and bowel control.  Improve sexual response.  Improve weak pelvic floor muscles after surgery to remove the uterus (hysterectomy) or pregnancy (females).  Improve weak pelvic floor muscles after prostate gland removal or surgery (males). Kegel exercises involve squeezing your pelvic floor muscles, which are the same muscles you squeeze when you try to stop the flow of urine or keep from passing gas. The exercises can be done while sitting, standing, or lying down, but it is best to vary your position. Exercises How to do Kegel exercises: 1. Squeeze your pelvic floor muscles tight. You should feel a tight lift in your rectal area. If you are a female, you should also feel a tightness in your vaginal area. Keep your stomach, buttocks, and legs relaxed. 2. Hold the muscles tight for up to 10 seconds. 3. Breathe normally. 4. Relax your muscles. 5. Repeat as told by your health care provider. Repeat this exercise daily as told by your health care provider. Continue to do this exercise for at least 4-6 weeks, or for as long as told by your health care  provider. You may be referred to a physical therapist who can help you learn more about how to do Kegel exercises. Depending on your condition, your health care provider may recommend:  Varying how long you squeeze your muscles.  Doing several sets of exercises every day.  Doing exercises for several weeks.  Making Kegel exercises a part of your regular exercise routine. This information is not intended to replace advice given to you by your health care provider. Make sure you discuss any questions you have with your health care provider. Document Revised: 02/24/2018 Document Reviewed: 02/24/2018 Elsevier Patient Education  [place].

## 2019-12-24 DIAGNOSIS — N3941 Urge incontinence: Secondary | ICD-10-CM | POA: Insufficient documentation

## 2019-12-24 NOTE — Assessment & Plan Note (Signed)
S/p Eondocrinlogy evaluation.  thyroid nodule biopsy negative for malignancy.  Thyroid function is normal    Lab Results  Component Value Date   TSH 0.91 11/21/2019

## 2019-12-24 NOTE — Assessment & Plan Note (Signed)
She has resumed annual mammograms after perceiving a mass last year on self exam..  Last year's exam and  mammogram was normal.

## 2019-12-24 NOTE — Assessment & Plan Note (Signed)
patient is coping well with the loss of her husband and has no signs of unresolving grief.

## 2019-12-24 NOTE — Assessment & Plan Note (Signed)
Chronic,  Worse at night.  Wants to avoid medications.  Discussed Kegel exercises,  Lifestyle changes to avoid increased water intake at night

## 2019-12-24 NOTE — Assessment & Plan Note (Signed)
Last DEXA was reportedly in 2013.  T scores reportedly -1.1 at forearm.   Repeat DEXA ordered.

## 2020-01-04 ENCOUNTER — Ambulatory Visit
Admission: RE | Admit: 2020-01-04 | Discharge: 2020-01-04 | Disposition: A | Discharge status: Home / Self Care | Payer: PPO | Source: Ambulatory Visit | Attending: Internal Medicine | Admitting: Internal Medicine

## 2020-01-04 DIAGNOSIS — Z1231 Encounter for screening mammogram for malignant neoplasm of breast: Secondary | ICD-10-CM | POA: Diagnosis not present

## 2020-02-03 IMAGING — MG DIGITAL SCREENING BILATERAL MAMMOGRAM WITH TOMO AND CAD
8 series · 9 of 24 positions shown · non-contrast
Comparison: Previous exam(s).

CLINICAL DATA: Screening.

EXAM:
DIGITAL SCREENING BILATERAL MAMMOGRAM WITH TOMO AND CAD

[R CC synth-2D]
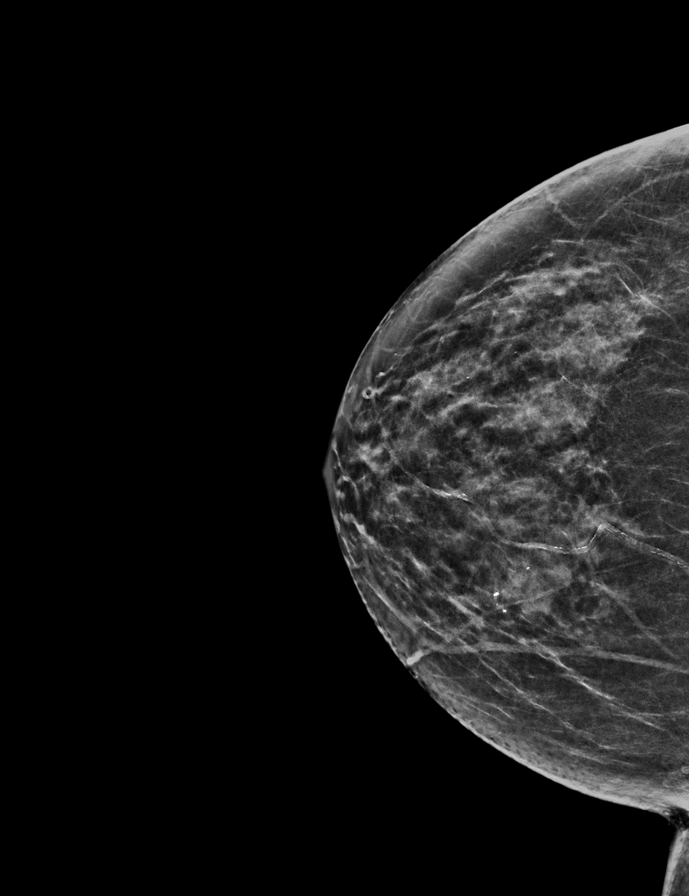

[L CC synth-2D]
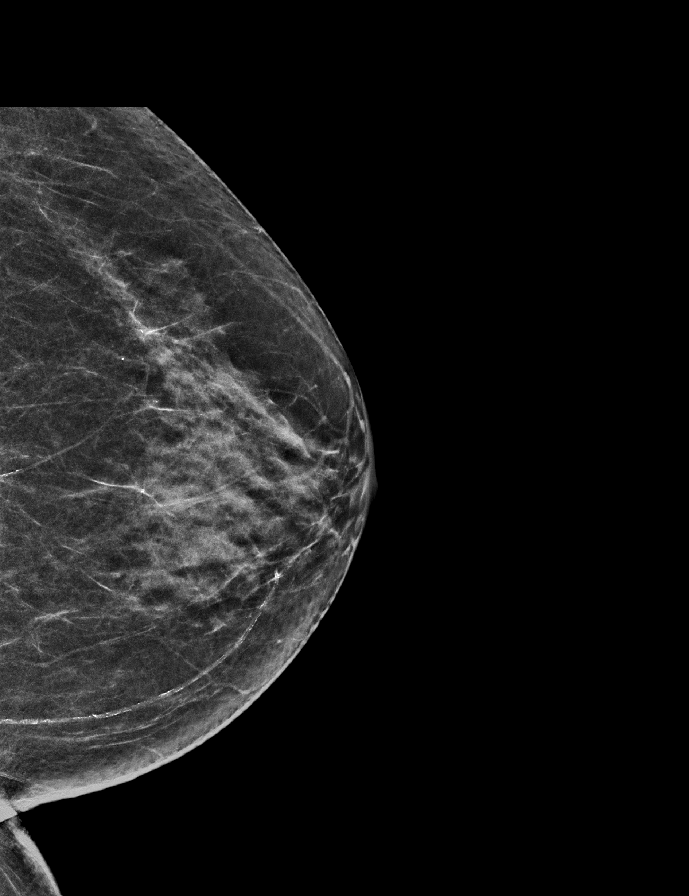

[L MLO synth-2D]
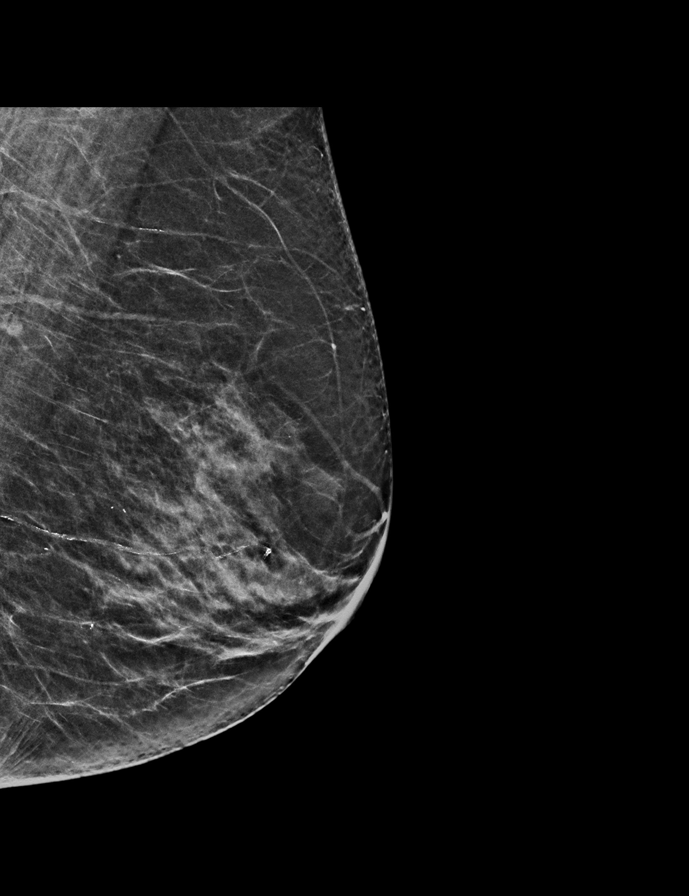

[R MLO synth-2D]
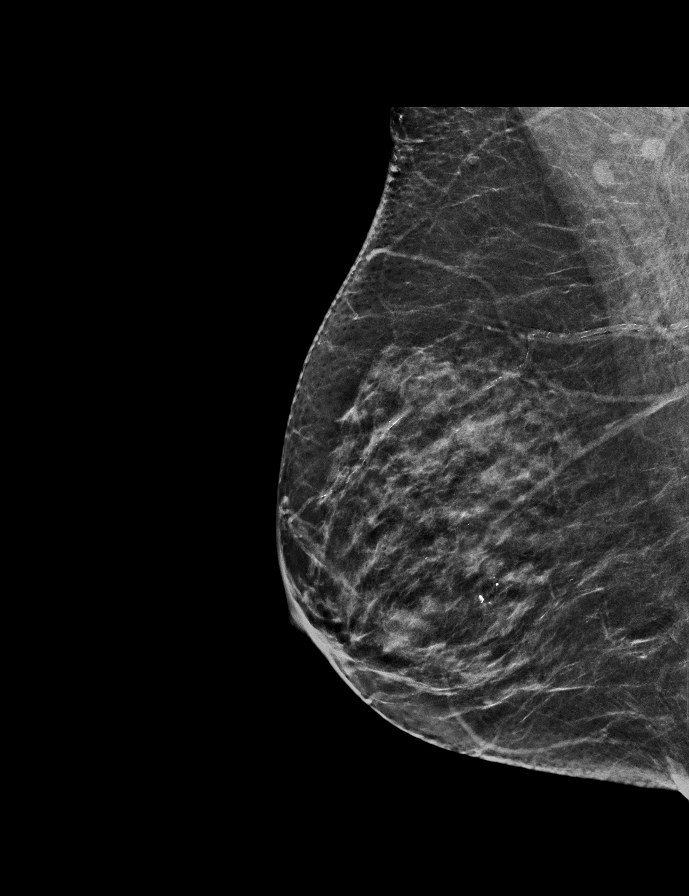

[L CC tomo · 2 of 55 frames shown]
[frame 18/55]
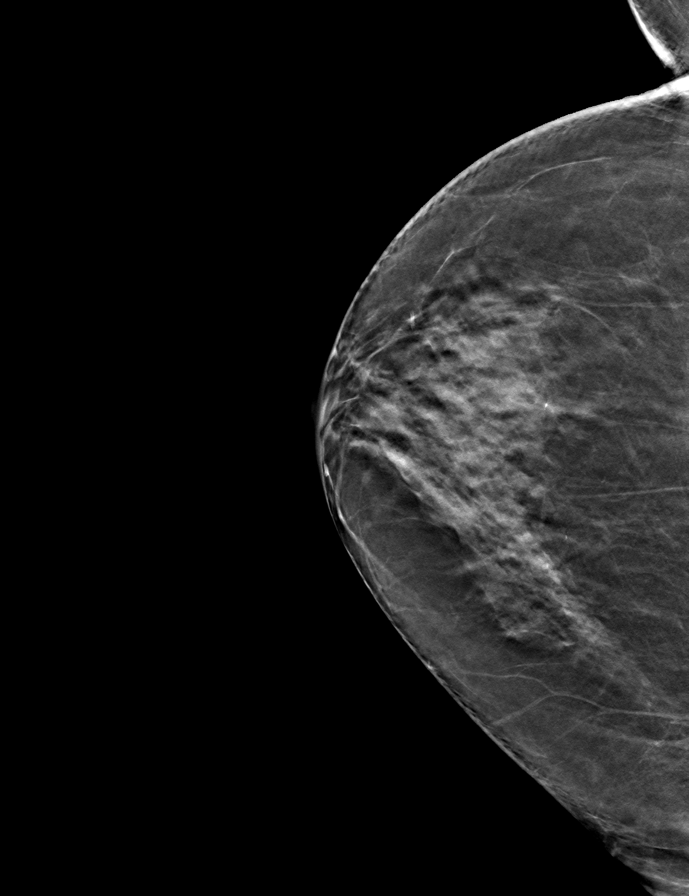
[frame 28/55]
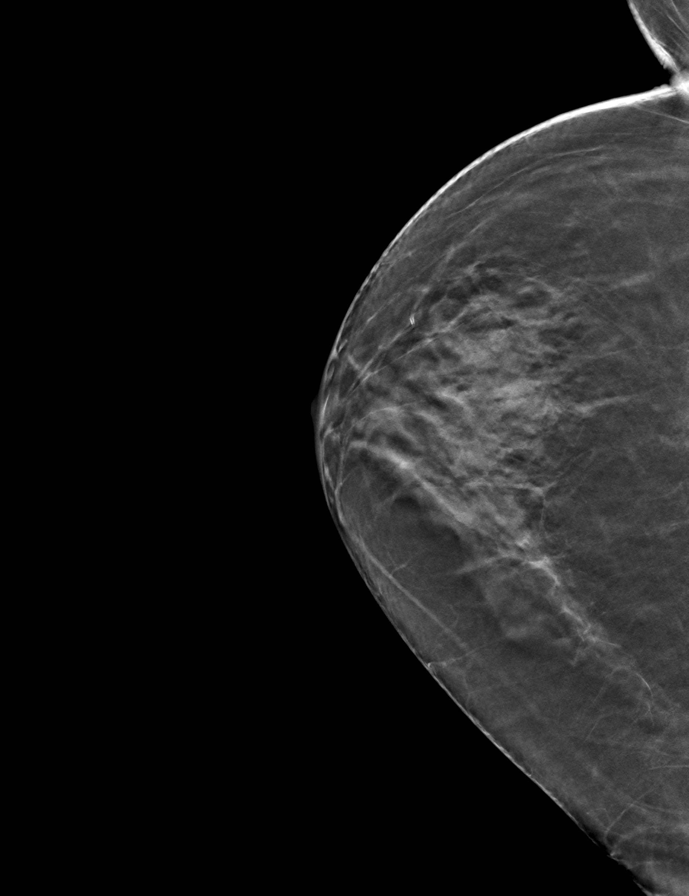

[L MLO tomo · tomo slice 27/54.0]
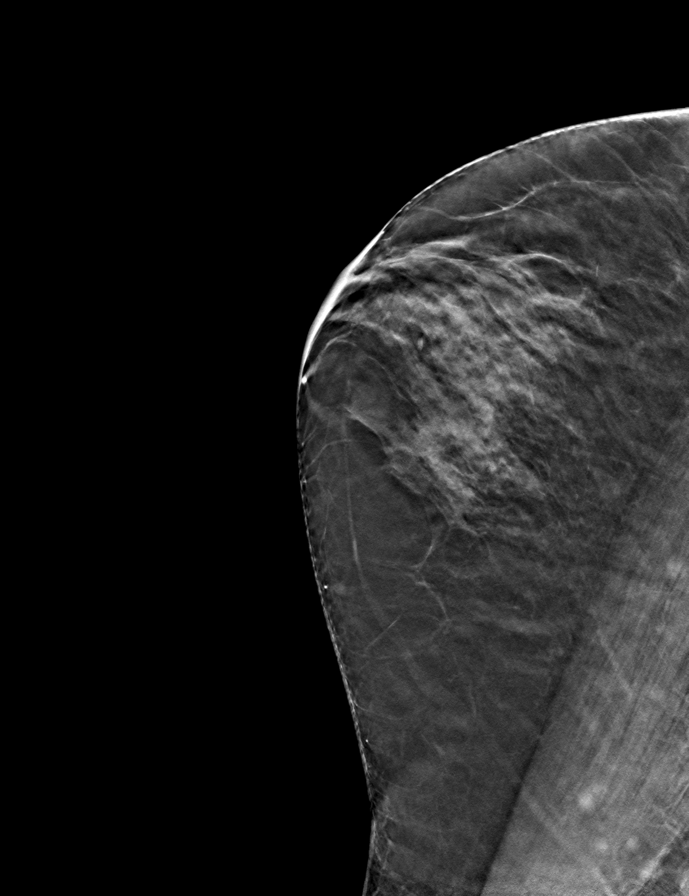

[R MLO tomo · tomo slice 29/57.0]
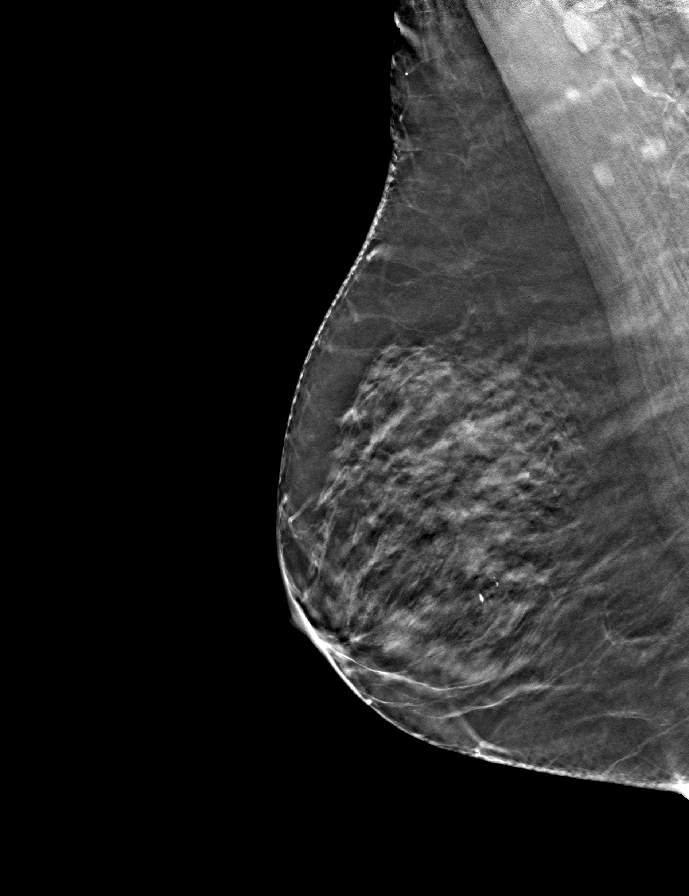

[R CC tomo · tomo slice 29/57.0]
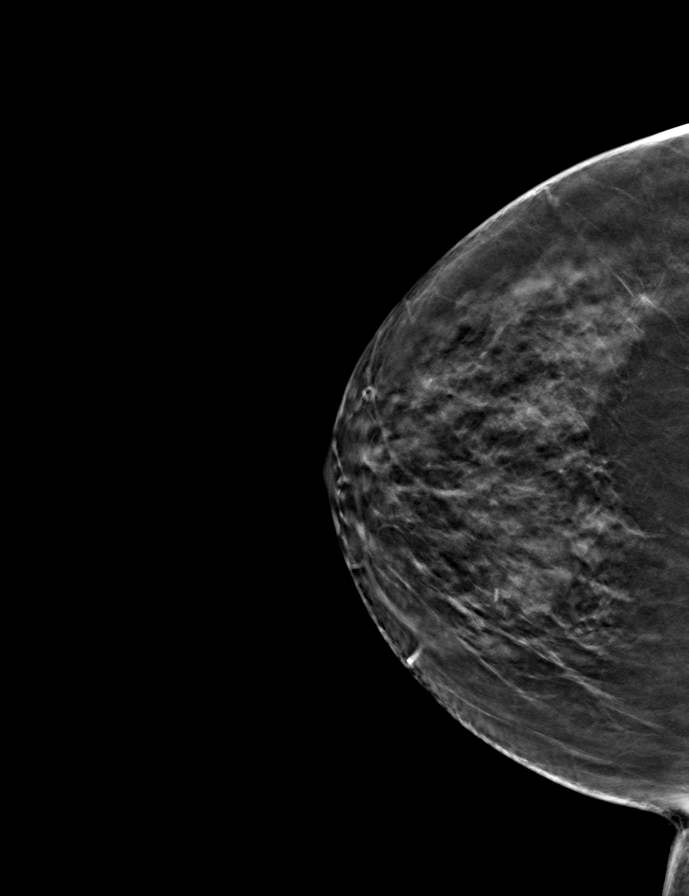

[9 of 24 positions shown; findings below may reference images not displayed]

ACR Breast Density Category c: The breast tissue is heterogeneously
dense, which may obscure small masses.
FINDINGS: There are no findings suspicious for malignancy. Images were
processed with CAD.
IMPRESSION: No mammographic evidence of malignancy. A result letter of this
screening mammogram will be mailed directly to the patient.

RECOMMENDATION:
Screening mammogram in one year. (Code:[id number])

BI-RADS CATEGORY  1: Negative.

## 2020-03-05 DIAGNOSIS — L600 Ingrowing nail: Secondary | ICD-10-CM | POA: Diagnosis not present

## 2020-03-05 DIAGNOSIS — M79675 Pain in left toe(s): Secondary | ICD-10-CM | POA: Diagnosis not present

## 2020-03-05 DIAGNOSIS — M79674 Pain in right toe(s): Secondary | ICD-10-CM | POA: Diagnosis not present

## 2020-03-05 DIAGNOSIS — M899 Disorder of bone, unspecified: Secondary | ICD-10-CM | POA: Diagnosis not present

## 2020-03-20 ENCOUNTER — Telehealth: Payer: Self-pay | Admitting: Internal Medicine

## 2020-03-20 NOTE — Telephone Encounter (Signed)
Last A1c was 08/26/2018 and it was normal.

## 2020-03-20 NOTE — Telephone Encounter (Signed)
Pt wants to know if she has had blood work to determine if she has diabetes? She said you can leave a detailed message on the phone if she doesn't answer.

## 2020-03-20 NOTE — Telephone Encounter (Signed)
Correct.  She is screened on a regular basis, has no signs of diabetes.  Last a1c of 5.5 last year was normal.

## 2020-03-21 NOTE — Telephone Encounter (Signed)
Pt called returning your call 

## 2020-03-21 NOTE — Telephone Encounter (Signed)
Attempted to call. No answer no voicemail. 

## 2020-03-21 NOTE — Telephone Encounter (Signed)
Left a detailed message for pt as requested.

## 2020-04-03 ENCOUNTER — Encounter: Payer: Self-pay | Admitting: Internal Medicine

## 2020-04-04 DIAGNOSIS — Z961 Presence of intraocular lens: Secondary | ICD-10-CM | POA: Diagnosis not present

## 2020-04-16 DIAGNOSIS — M5030 Other cervical disc degeneration, unspecified cervical region: Secondary | ICD-10-CM | POA: Diagnosis not present

## 2020-04-16 DIAGNOSIS — M5412 Radiculopathy, cervical region: Secondary | ICD-10-CM | POA: Diagnosis not present

## 2020-04-23 ENCOUNTER — Other Ambulatory Visit: Payer: Self-pay

## 2020-04-23 ENCOUNTER — Ambulatory Visit
Admission: RE | Admit: 2020-04-23 | Discharge: 2020-04-23 | Disposition: A | Discharge status: Home / Self Care | Payer: PPO | Source: Ambulatory Visit | Attending: Internal Medicine | Admitting: Internal Medicine

## 2020-04-23 DIAGNOSIS — Z780 Asymptomatic menopausal state: Secondary | ICD-10-CM | POA: Diagnosis not present

## 2020-04-23 DIAGNOSIS — R29890 Loss of height: Secondary | ICD-10-CM | POA: Diagnosis not present

## 2020-04-23 DIAGNOSIS — Z90722 Acquired absence of ovaries, bilateral: Secondary | ICD-10-CM | POA: Diagnosis not present

## 2020-04-23 DIAGNOSIS — M8589 Other specified disorders of bone density and structure, multiple sites: Secondary | ICD-10-CM | POA: Diagnosis not present

## 2020-04-24 ENCOUNTER — Telehealth: Payer: Self-pay

## 2020-04-24 NOTE — Telephone Encounter (Signed)
Patient returned office phone call about lab results. 

## 2020-04-24 NOTE — Telephone Encounter (Signed)
LMTCB in regards to lab results.  

## 2020-04-25 NOTE — Telephone Encounter (Signed)
See result note message 

## 2020-05-21 DIAGNOSIS — H1132 Conjunctival hemorrhage, left eye: Secondary | ICD-10-CM | POA: Diagnosis not present

## 2020-06-25 ENCOUNTER — Ambulatory Visit (INDEPENDENT_AMBULATORY_CARE_PROVIDER_SITE_OTHER): Payer: PPO | Admitting: Internal Medicine

## 2020-06-25 ENCOUNTER — Encounter: Payer: Self-pay | Admitting: Internal Medicine

## 2020-06-25 ENCOUNTER — Other Ambulatory Visit: Payer: Self-pay

## 2020-06-25 DIAGNOSIS — J019 Acute sinusitis, unspecified: Secondary | ICD-10-CM | POA: Insufficient documentation

## 2020-06-25 DIAGNOSIS — J0110 Acute frontal sinusitis, unspecified: Secondary | ICD-10-CM | POA: Diagnosis not present

## 2020-06-25 DIAGNOSIS — F4321 Adjustment disorder with depressed mood: Secondary | ICD-10-CM | POA: Diagnosis not present

## 2020-06-25 MED ORDER — PREDNISONE 10 MG PO TABS
ORAL_TABLET | ORAL | 0 refills | Status: DC
Start: 2020-06-25 — End: 2020-11-23

## 2020-06-25 NOTE — Assessment & Plan Note (Signed)
Reviewed her symptoms currently.   She was not alone for the holidays.  She is staying busy

## 2020-06-25 NOTE — Patient Instructions (Signed)
You can take 50 mcg of Vitamin D3 daily until May.  Then take the lower dose daily for he summer months  I am prescribing prednisone in a tapering dose over 6 days to manage the inflammation in your sinuses .    You can also try taking Tylenol "PM"  (tylenol plus diphenhydramine or benadryl)  At nighttime ,  One hour before bed   To dry up the nose, or just plain generic benadryl   I do recommend the flu vaccine,  But I would postpone it for 2 weeks

## 2020-06-25 NOTE — Assessment & Plan Note (Addendum)
Present for 2 weeks,  primary symptoms is PND.  drainage is clear.  Prednisone taper and Tylenol PM

## 2020-06-25 NOTE — Progress Notes (Signed)
Subjective:  Patient ID: Bridget [name], female    DOB: 12-Dec-1930  Age: 86 y.o. MRN: [id number] ,  CC: Diagnoses of Acute non-recurrent frontal sinusitis and Grief reaction were pertinent to this visit.  HPI Bridget [name] presents for constant sinus drainage for the past 2  Weeks or more. Symptoms started after spending a few hours  Doing yardwork raking leaves, which included contact with moldy material.  Nose runs in the morning with breakfast but the rest of the time it is draining down the back of her throat    No coughing or shortness of breath  .  Drainage is Clear.  She denies  body aches,  Fever .  No sneezing or itchy watering eyes.   No facial pain But Feels a fullness on the left side of face over the nose and inner eye corner.  d Outpatient Medications Prior to Visit  Medication Sig Dispense Refill  . acetaminophen (TYLENOL) 500 MG tablet Take 1,000 mg by mouth daily as needed.    . Calcium Carbonate-Vitamin D (CALCIUM + D) 600-200 MG-UNIT TABS Take 1 tablet by mouth daily.      . mometasone (ELOCON) 0.1 % cream Apply 1 application topically daily. 45 g 0  . Multiple Vitamins-Minerals (CENTRUM SILVER PO) Take 1 tablet by mouth daily.      . pantoprazole (PROTONIX) 40 MG tablet Take 1 tablet (40 mg total) by mouth daily. 90 tablet 1   No facility-administered medications prior to visit.    Review of Systems;  Patient denies headache, fevers, malaise, unintentional weight loss, skin rash, eye pain, sinus congestion and sinus pain, sore throat, dysphagia,  hemoptysis , cough, dyspnea, wheezing, chest pain, palpitations, orthopnea, edema, abdominal pain, nausea, melena, diarrhea, constipation, flank pain, dysuria, hematuria, urinary  Frequency, nocturia, numbness, tingling, seizures,  Focal weakness, Loss of consciousness,  Tremor, insomnia, depression, anxiety, and suicidal ideation.      Objective:  BP 134/70 (BP Location: Left Arm, Patient Position: Sitting, Cuff Size:  Normal)   Pulse 68   Resp 14   Ht 5\' 5"  (1.651 m)   Wt 144 lb (65.3 kg)   SpO2 97%   BMI 23.96 kg/m   BP Readings from Last 3 Encounters:  06/25/20 134/70  12/22/19 120/68  11/21/19 120/78    Wt Readings from Last 3 Encounters:  06/25/20 144 lb (65.3 kg)  12/22/19 144 lb 9.6 oz (65.6 kg)  11/21/19 145 lb 3.2 oz (65.9 kg)    General appearance: alert, cooperative and appears stated age Ears: normal TM's and external ear canals both ears Throat: lips, mucosa, and tongue normal; teeth and gums normal Neck: no adenopathy, no carotid bruit, supple, symmetrical, trachea midline and thyroid not enlarged, symmetric, no tenderness/mass/nodules Back: symmetric, no curvature. ROM normal. No CVA tenderness. Lungs: clear to auscultation bilaterally Heart: regular rate and rhythm, S1, S2 normal, no murmur, click, rub or gallop Abdomen: soft, non-tender; bowel sounds normal; no masses,  no organomegaly Pulses: 2+ and symmetric Skin: Skin color, texture, turgor normal. No rashes or lesions Lymph nodes: Cervical, supraclavicular, and axillary nodes normal.  Lab Results  Component Value Date   HGBA1C 5.5 08/26/2018    Lab Results  Component Value Date   CREATININE 0.80 11/21/2019   CREATININE 0.83 02/08/2019   CREATININE 0.76 08/26/2018    Lab Results  Component Value Date   WBC 6.1 11/21/2019   HGB 13.0 11/21/2019   HCT 38.3 11/21/2019   PLT 290.0 11/21/2019  GLUCOSE 105 (H) 11/21/2019   CHOL 172 08/26/2018   TRIG 50.0 08/26/2018   HDL 56.40 08/26/2018   LDLCALC 105 (H) 08/26/2018   ALT 10 11/21/2019   AST 16 11/21/2019   NA 139 11/21/2019   K 4.1 11/21/2019   CL 105 11/21/2019   CREATININE 0.80 11/21/2019   BUN 14 11/21/2019   CO2 29 11/21/2019   TSH 0.91 11/21/2019   HGBA1C 5.5 08/26/2018   MICROALBUR 1.2 10/12/2015    DG Bone Density  Result Date: 04/23/2020 EXAM: DUAL X-RAY ABSORPTIOMETRY (DXA) FOR BONE MINERAL DENSITY IMPRESSION: Your patient Bridget [name]  completed a BMD test on 04/23/2020 using the [place] (software version: 14.10) manufactured by [employer]. The following summarizes the results of our evaluation. Technologist: SCE PATIENT BIOGRAPHICAL: Name: Bridget, [name] Patient ID: [id number] Birth Date: 08/02/1930 Height: 64.0 in. Gender: Female Exam Date: 04/23/2020 Weight: 142.0 lbs. Indications: Advanced Age, Height Loss, History of Spinal Surgery, Hysterectomy, Oophorectomy Bilateral, Osteopenia, Postmenopausal Fractures: Treatments: calcium w/ vit D, Multi-Vitamin, Protonix, Vitamin D DENSITOMETRY RESULTS: Site         Region      Measured Date Measured Age WHO Classification Young Adult T-score BMD         %Change vs. Previous Significant Change (*) DualFemur Total Right 04/23/2020 87.3 Osteopenia -1.5 0.818 g/cm2 - - Left Forearm Radius 33% 04/23/2020 87.3 Osteopenia -2.0 0.703 g/cm2 - - ASSESSMENT: The BMD measured at Forearm Radius 33% is 0.703 g/cm2 with a T-score of -2.0. This patient is considered osteopenic according to [facility]) criteria. The scan quality is good. Lumbar spine was not utilized due to advanced degenerative changes and history of spinal surgery. [facility]) criteria for post-menopausal, Caucasian Women: Normal:                   T-score at or above -1 SD Osteopenia/low bone mass: T-score between -1 and -2.5 SD Osteoporosis:             T-score at or below -2.5 SD RECOMMENDATIONS: 1. All patients should optimize calcium and vitamin D intake. 2. Consider FDA-approved medical therapies in postmenopausal women and men aged 15 years and older, based on the following: a. A hip or vertebral(clinical or morphometric) fracture b. T-score < -2.5 at the femoral neck or spine after appropriate evaluation to exclude secondary causes c. Low bone mass (T-score between -1.0 and -2.5 at the femoral neck or spine) and a 10-year probability of a hip fracture > 3% or a 10-year  probability of a major osteoporosis-related fracture > 20% based on the [place]-adapted WHO algorithm 3. Clinician judgment and/or patient preferences may indicate treatment for people with 10-year fracture probabilities above or below these levels FOLLOW-UP: People with diagnosed cases of osteoporosis or at high risk for fracture should have regular bone mineral density tests. For patients eligible for Medicare, routine testing is allowed once every 2 years. The testing frequency can be increased to one year for patients who have rapidly progressing disease, those who are receiving or discontinuing medical therapy to restore bone mass, or have additional risk factors. I have reviewed this report, and agree with the above findings. [facility] Dear Dr Derrel Nip, Your patient Jamilya ALETHEIA TANGREDI completed a FRAX assessment on 04/23/2020 using the [place] (analysis version: 14.10) manufactured by [employer]. The following summarizes the results of our evaluation. PATIENT BIOGRAPHICAL: Name: Tramya, Schoenfelder Patient ID: [id number] Birth  Date: Nov 01, 1930 Height:    64.0 in. Gender:     Female    [age]       Weight:    142.0 lbs. Ethnicity:  Black                            Exam Date: 04/23/2020 FRAX* RESULTS:  (version: 3.5) 10-year Probability of Fracture1 Major Osteoporotic Fracture2 Hip Fracture 4.7% 1.2% Population: [place] (Black) Risk Factors: None Based on Femur (Right) Neck BMD 1 -The 10-year probability of fracture may be lower than reported if the patient has received treatment. 2 -Major Osteoporotic Fracture: Clinical Spine, Forearm, Hip or Shoulder *FRAX is a Materials engineer of the [employer] for Metabolic Bone Disease, a [place] (WHO) [facility]. ASSESSMENT: The probability of a major osteoporotic fracture is 4.7% within the next ten years. The probability of a hip fracture is 1.2% within the next ten years. . Electronically  Signed   By: Lowella Grip III M.D.   On: 04/23/2020 14:30    Assessment & Plan:   Problem List Items Addressed This Visit      Unprioritized   Grief reaction    Reviewed her symptoms currently.   She was not alone for the holidays.  She is staying busy      Acute inflammation of sinus    Present for 2 weeks,  primary symptoms is PND.  drainage is clear.  Prednisone taper and Tylenol PM       Relevant Medications   predniSONE (DELTASONE) 10 MG tablet      I am having Aysa G. Calais start on predniSONE. I am also having her maintain her Multiple Vitamins-Minerals (CENTRUM SILVER PO), Calcium Carbonate-Vitamin D, acetaminophen, mometasone, and pantoprazole.  Meds ordered this encounter  Medications  . predniSONE (DELTASONE) 10 MG tablet    Sig: 6 tablets on Day 1 , then reduce by 1 tablet daily until gone    Dispense:  21 tablet    Refill:  0    There are no discontinued medications.  Follow-up: No follow-ups on file.   Crecencio Mc, MD

## 2020-07-16 ENCOUNTER — Other Ambulatory Visit: Payer: Self-pay | Admitting: Student

## 2020-07-16 DIAGNOSIS — R2242 Localized swelling, mass and lump, left lower limb: Secondary | ICD-10-CM

## 2020-07-16 DIAGNOSIS — M79672 Pain in left foot: Secondary | ICD-10-CM | POA: Diagnosis not present

## 2020-07-16 DIAGNOSIS — L03116 Cellulitis of left lower limb: Secondary | ICD-10-CM | POA: Diagnosis not present

## 2020-07-17 ENCOUNTER — Other Ambulatory Visit: Payer: Self-pay

## 2020-07-17 ENCOUNTER — Ambulatory Visit
Admission: RE | Admit: 2020-07-17 | Discharge: 2020-07-17 | Disposition: A | Discharge status: Home / Self Care | Payer: PPO | Source: Ambulatory Visit | Attending: Student | Admitting: Student

## 2020-07-17 DIAGNOSIS — R2242 Localized swelling, mass and lump, left lower limb: Secondary | ICD-10-CM | POA: Diagnosis not present

## 2020-07-17 DIAGNOSIS — M79605 Pain in left leg: Secondary | ICD-10-CM | POA: Diagnosis not present

## 2020-07-17 DIAGNOSIS — M7989 Other specified soft tissue disorders: Secondary | ICD-10-CM | POA: Diagnosis not present

## 2020-08-18 ENCOUNTER — Other Ambulatory Visit: Payer: Self-pay | Admitting: Internal Medicine

## 2020-09-04 ENCOUNTER — Other Ambulatory Visit: Payer: Self-pay

## 2020-09-04 ENCOUNTER — Ambulatory Visit (INDEPENDENT_AMBULATORY_CARE_PROVIDER_SITE_OTHER): Payer: PPO | Admitting: Podiatry

## 2020-09-04 DIAGNOSIS — L989 Disorder of the skin and subcutaneous tissue, unspecified: Secondary | ICD-10-CM | POA: Diagnosis not present

## 2020-09-04 DIAGNOSIS — M7751 Other enthesopathy of right foot: Secondary | ICD-10-CM | POA: Diagnosis not present

## 2020-09-04 NOTE — Progress Notes (Signed)
    Subjective: Patient is a 87 y.o. female presenting to the office today with a chief complaint of a painful callus lesion to the right foot that has been present for several months. Applying pressure to the area increases the pain. She has not done anything to treat the symptoms.   Patient also states for the past few months she has had pain and tenderness to the right forefoot.  She denies a history of injury.  She presents for further treatment evaluation  Past Medical History:  Diagnosis Date  . Diverticulosis of colon without diverticulitis   . GERD (gastroesophageal reflux disease)     Objective:  Physical Exam General: Alert and oriented x3 in no acute distress  Dermatology: Hyperkeratotic lesion present on the right foot. Pain on palpation with a central nucleated core noted. Skin is warm, dry and supple bilateral lower extremities. Negative for open lesions or macerations.   Vascular: Palpable pedal pulses bilaterally. No edema or erythema noted. Capillary refill within normal limits.  Neurological: Epicritic and protective threshold grossly intact bilaterally.   Musculoskeletal Exam: Pain on palpation at the keratotic lesion noted.  There is also pain on palpation and range of motion of the fourth MTPJ of the right foot.  Range of motion within normal limits bilateral. Muscle strength 5/5 in all groups bilateral.  Assessment: 1.  Preulcerative callus lesion subfirst MTPJ right foot   Plan of Care:  #1 Patient evaluated. #2 Excisional debridement of keratoic lesion using a chisel blade was performed without incident.  #3 Treated area with Salinocaine and light dressing applied. #4  Patient declined injection or any oral anti-inflammatories for the capsulitis of the right forefoot.  Recommend that she discontinues wearing good supportive shoes to see if it eventually alleviates her symptoms #5 return to clinic as needed  Edrick Kins, DPM [facility]  Dr. Edrick Kins, DPM    [address], [place] [address]                Office [phone]  Fax [phone]

## 2020-11-23 ENCOUNTER — Ambulatory Visit (INDEPENDENT_AMBULATORY_CARE_PROVIDER_SITE_OTHER): Payer: PPO

## 2020-11-23 VITALS — Ht 65.0 in | Wt 144.0 lb

## 2020-11-23 DIAGNOSIS — Z0000 Encounter for general adult medical examination without abnormal findings: Secondary | ICD-10-CM

## 2020-11-23 DIAGNOSIS — Z1231 Encounter for screening mammogram for malignant neoplasm of breast: Secondary | ICD-10-CM | POA: Diagnosis not present

## 2020-11-23 NOTE — Progress Notes (Signed)
Subjective:   Bridget [name] is a 87 y.o. female who presents for Medicare Annual (Subsequent) preventive examination.  Review of Systems    No ROS.  Medicare Wellness Virtual Visit.   Cardiac Risk Factors include: hypertension;advanced age (>68men, >56 women)     Objective:    Today's Vitals   11/23/20 0949  Weight: 144 lb (65.3 kg)  Height: 5\' 5"  (1.651 m)   Body mass index is 23.96 kg/m.  Advanced Directives 11/23/2020 10/29/2017 09/25/2017 07/25/2015 04/09/2015  Does Patient Have a Medical Advance Directive? Yes No No Yes Yes  Type of Advance Directive Living will - - [place];Living will Living will  Does patient want to make changes to medical advance directive? No - Patient declined - - No - Patient declined No - Patient declined  Copy of [place] in Chart? - - - No - copy requested -  Would patient like information on creating a medical advance directive? - No - Patient declined - - -    Current Medications (verified) Outpatient Encounter Medications as of 11/23/2020  Medication Sig  . acetaminophen (TYLENOL) 500 MG tablet Take 1,000 mg by mouth daily as needed.  . Calcium Carbonate-Vitamin D (CALCIUM + D) 600-200 MG-UNIT TABS Take 1 tablet by mouth daily.    . mometasone (ELOCON) 0.1 % cream Apply 1 application topically daily.  . Multiple Vitamins-Minerals (CENTRUM SILVER PO) Take 1 tablet by mouth daily.    . pantoprazole (PROTONIX) 40 MG tablet TAKE 1 TABLET BY MOUTH ONCE DAILY  . [DISCONTINUED] predniSONE (DELTASONE) 10 MG tablet 6 tablets on Day 1 , then reduce by 1 tablet daily until gone   No facility-administered encounter medications on file as of 11/23/2020.    Allergies (verified) Sulfa antibiotics   History: Past Medical History:  Diagnosis Date  . Diverticulosis of colon without diverticulitis   . GERD (gastroesophageal reflux disease)    Past Surgical History:  Procedure Laterality Date  . ABDOMINAL  HYSTERECTOMY     Not due to cancer, due to fibroids, BSO  . OOPHORECTOMY     Bilateral  . TOE SURGERY  06/2015   R Big toe Ingrown toenail removed     Family History  Problem Relation Age of Onset  . Heart failure Mother        Massive MI  . Cancer Brother        prostate  . Cancer Sister        colon  . Cancer Sister        lung  . Cancer Sister        bone  . Cancer Brother        lung (smoker)  . Breast cancer Neg Hx    Social History   Socioeconomic History  . Marital status: Widowed    Spouse name: Not on file  . Number of children: Not on file  . Years of education: Not on file  . Highest education level: Not on file  Occupational History  . 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 Smoking status: Never Smoker  . Smokeless tobacco: Never Used  Substance and Sexual Activity  . Alcohol use: No  . Drug use: No  . Sexual activity: Not Currently  Other Topics Concern  . Not on file  Social History Narrative  . Not on file   Social Determinants of Health   Financial Resource  Strain: Low Risk   . Difficulty of Paying Living Expenses: Not hard at all  Food Insecurity: No Food Insecurity  . Worried About Charity fundraiser in the Last Year: Never true  . Ran Out of Food in the Last Year: Never true  Transportation Needs: No Transportation Needs  . Lack of Transportation (Medical): No  . Lack of Transportation (Non-Medical): No  Physical Activity: Insufficiently Active  . Days of Exercise per Week: 2 days  . Minutes of Exercise per Session: 30 min  Stress: No Stress Concern Present  . Feeling of Stress : Not at all  Social Connections: Unknown  . Frequency of Communication with Friends and Family: More than three times a week  . Frequency of Social Gatherings with Friends and Family: More than three times a week  . Attends Religious Services: 1 to 4 times per year  . Active Member of Clubs or Organizations:  Yes  . Attends Archivist Meetings: Not on file  . Marital Status: Not on file    Tobacco Counseling Counseling given: Not Answered   Clinical Intake:  Pre-visit preparation completed: Yes        Diabetes: No  How often do you need to have someone help you when you read instructions, pamphlets, or other written materials from your doctor or pharmacy?: 1 - Never    Interpreter Needed?: No      Activities of Daily Living In your present state of health, do you have any difficulty performing the following activities: 11/23/2020  Hearing? N  Vision? N  Difficulty concentrating or making decisions? N  Walking or climbing stairs? N  Dressing or bathing? N  Doing errands, shopping? N  Preparing Food and eating ? N  Using the Toilet? N  In the past six months, have you accidently leaked urine? N  Do you have problems with loss of bowel control? N  Managing your Medications? N  Managing your Finances? N  Housekeeping or managing your Housekeeping? N  Some recent data might be hidden    Patient Care Team: Crecencio Mc, MD as PCP - General (Internal Medicine)  Indicate any recent Medical Services you may have received from other than [facility] providers in the past year (date may be approximate).     Assessment:   This is a routine wellness examination for Bridget [name].  I connected with Bridget [name] today by telephone and verified that I am speaking with the correct person using two identifiers. Location patient: home Location provider: work Persons participating in the virtual visit: patient, Marine scientist.    I discussed the limitations, risks, security and privacy concerns of performing an evaluation and management service by telephone and the availability of in person appointments. The patient expressed understanding and verbally consented to this telephonic visit.    Interactive audio and video telecommunications were attempted between this provider and patient, however  failed, due to patient having technical difficulties OR patient did not have access to video capability.  We continued and completed visit with audio only.  Some vital signs may be absent or patient reported.   Hearing/Vision screen  Hearing Screening   125Hz  250Hz  500Hz  1000Hz  2000Hz  3000Hz  4000Hz  6000Hz  8000Hz   Right ear:           Left ear:           Comments: Patient is able to hear conversational tones without difficulty.  No issues reported.  Vision Screening Comments: Wears corrective lenses Visual acuity not assessed,  virtual visit.       Dietary issues and exercise activities discussed:    Goals Addressed            This Visit's Progress   . Maintain Healthy Lifestyle       Stay active Healthy diet      Depression Screen PHQ 2/9 Scores 11/23/2020 06/25/2020 10/29/2017 03/05/2017 10/12/2015 07/25/2015 01/30/2015  PHQ - 2 Score 0 0 0 0 0 0 0    Fall Risk Fall Risk  11/23/2020 06/25/2020 12/22/2019 11/21/2019 10/29/2017  Falls in the past year? 0 0 0 0 No  Number falls in past yr: 0 - - - -  Injury with Fall? 0 - - - -  Follow up Falls evaluation completed Falls evaluation completed Falls evaluation completed Falls evaluation completed -    FALL RISK PREVENTION PERTAINING TO THE HOME: Handrails in ujse when climbing stairs? Yes Home free of loose throw rugs in walkways, pet beds, electrical cords, etc? Yes  Adequate lighting in your home to reduce risk of falls? Yes   ASSISTIVE DEVICES UTILIZED TO PREVENT FALLS: Life alert? No  Use of a cane, walker or w/c? Yes   TIMED UP AND GO: Was the test performed? Yes .   Cognitive Function: Patient is alert and oriented x3.  Denies difficulty focusing, making decisions, memory loss.  MMSE - Mini Mental State Exam 07/25/2015  Orientation to time 5  Orientation to Place 5  Registration 3  Attention/ Calculation 5  Recall 3  Language- name 2 objects 2  Language- repeat 1  Language- follow 3 step command 3  Language- read &  follow direction 1  Write a sentence 1  Copy design 1  Total score 30     6CIT Screen 10/29/2017  What Year? 0 points  What month? 0 points  What time? 0 points  Count back from 20 0 points  Months in reverse 0 points  Repeat phrase 0 points  Total Score 0    Immunizations Immunization History  Administered Date(s) Administered  . PFIZER(Purple Top)SARS-COV-2 Vaccination 08/22/2019, 09/12/2019, 06/06/2020  . Pneumococcal Conjugate-13 10/12/2015  . Pneumococcal Polysaccharide-23 02/25/2012, 08/26/2018  . Td 01/14/2017    Health Maintenance There are no preventive care reminders to display for this patient. Health Maintenance  Topic Date Due  . TETANUS/TDAP  01/15/2027  . DEXA SCAN  Completed  . COVID-19 Vaccine  Completed  . PNA vac Low Risk Adult  Completed  . HPV VACCINES  Aged Out  . INFLUENZA VACCINE  Discontinued   Colorectal cancer screening: No longer required.   Mammogram status: Completed 01/04/20. Repeat every year. Ordered per consent.   Lung Cancer Screening: (Low Dose CT Chest recommended if Age 81-80 years, 30 pack-year currently smoking OR have quit w/in 15years.) does not qualify.   Vision Screening: Recommended annual ophthalmology exams for early detection of glaucoma and other disorders of the eye. Is the patient up to date with their annual eye exam?  Yes   Dental Screening: Recommended annual dental exams for proper oral hygiene.   Community Resource Referral / Chronic Care Management: CRR required this visit?  No   CCM required this visit?  No      Plan:   Keep all routine maintenance appointments.   Annual 01/18/21 @ 9:00  Call [phone] to schedule mammogram.  I have personally reviewed and noted the following in the patient's chart:   . Medical and social history . Use of alcohol, tobacco or illicit  drugs  . Current medications and supplements including opioid prescriptions.  . Functional ability and status . Nutritional  status . Physical activity . Advanced directives . List of other physicians . Hospitalizations, surgeries, and ER visits in previous 12 months . Vitals . Screenings to include cognitive, depression, and falls . Referrals and appointments  In addition, I have reviewed and discussed with patient certain preventive protocols, quality metrics, and best practice recommendations. A written personalized care plan for preventive services as well as general preventive health recommendations were provided to patient.     Varney Biles, LPN   09/18/5398

## 2020-11-23 NOTE — Patient Instructions (Addendum)
Bridget [name] , Thank you for taking time to come for your Medicare Wellness Visit. I appreciate your ongoing commitment to your health goals. Please review the following plan we discussed and let me know if I can assist you in the future.   These are the goals we discussed: Goals    . Maintain Healthy Lifestyle     Stay active Healthy diet       This is a list of the screening recommended for you and due dates:  Health Maintenance  Topic Date Due  . Tetanus Vaccine  01/15/2027  . DEXA scan (bone density measurement)  Completed  . COVID-19 Vaccine  Completed  . Pneumonia vaccines  Completed  . HPV Vaccine  Aged Out  . Flu Shot  Discontinued    Immunizations Immunization History  Administered Date(s) Administered  . PFIZER(Purple Top)SARS-COV-2 Vaccination 08/22/2019, 09/12/2019, 06/06/2020  . Pneumococcal Conjugate-13 10/12/2015  . Pneumococcal Polysaccharide-23 02/25/2012, 08/26/2018  . Td 01/14/2017   Keep all routine maintenance appointments.   Annual 01/18/21 @ 9:00  Call [phone] to schedule mammogram.  Advanced directives: End of life planning; Advance aging; Advanced directives discussed.  Copy of current HCPOA/Living Will requested.    Conditions/risks identified: none  new  Follow up in one year for your annual wellness visit   Preventive Care 65 Years and Older, Female Preventive care refers to lifestyle choices and visits with your health care provider that can promote health and wellness. What does preventive care include?  A yearly physical exam. This is also called an annual well check.  Dental exams once or twice a year.  Routine eye exams. Ask your health care provider how often you should have your eyes checked.  Personal lifestyle choices, including:  Daily care of your teeth and gums.  Regular physical activity.  Eating a healthy diet.  Avoiding tobacco and drug use.  Limiting alcohol use.  Practicing safe sex.  Taking low-dose  aspirin every day.  Taking vitamin and mineral supplements as recommended by your health care provider. What happens during an annual well check? The services and screenings done by your health care provider during your annual well check will depend on your age, overall health, lifestyle risk factors, and family history of disease. Counseling  Your health care provider may ask you questions about your:  Alcohol use.  Tobacco use.  Drug use.  Emotional well-being.  Home and relationship well-being.  Sexual activity.  Eating habits.  History of falls.  Memory and ability to understand (cognition).  Work and work Statistician.  Reproductive health. Screening  You may have the following tests or measurements:  Height, weight, and BMI.  Blood pressure.  Lipid and cholesterol levels. These may be checked every 5 years, or more frequently if you are over 67 years old.  Skin check.  Lung cancer screening. You may have this screening every year starting at age 53 if you have a 30-pack-year history of smoking and currently smoke or have quit within the past 15 years.  Fecal occult blood test (FOBT) of the stool. You may have this test every year starting at age 76.  Flexible sigmoidoscopy or colonoscopy. You may have a sigmoidoscopy every 5 years or a colonoscopy every 10 years starting at age 74.  Hepatitis C blood test.  Hepatitis B blood test.  Sexually transmitted disease (STD) testing.  Diabetes screening. This is done by checking your blood sugar (glucose) after you have not eaten for a while (fasting). You may  have this done every 1-3 years.  Bone density scan. This is done to screen for osteoporosis. You may have this done starting at age 23.  Mammogram. This may be done every 1-2 years. Talk to your health care provider about how often you should have regular mammograms. Talk with your health care provider about your test results, treatment options, and if  necessary, the need for more tests. Vaccines  Your health care provider may recommend certain vaccines, such as:  Influenza vaccine. This is recommended every year.  Tetanus, diphtheria, and acellular pertussis (Tdap, Td) vaccine. You may need a Td booster every 10 years.  Zoster vaccine. You may need this after age 36.  Pneumococcal 13-valent conjugate (PCV13) vaccine. One dose is recommended after age 15.  Pneumococcal polysaccharide (PPSV23) vaccine. One dose is recommended after age 8. Talk to your health care provider about which screenings and vaccines you need and how often you need them. This information is not intended to replace advice given to you by your health care provider. Make sure you discuss any questions you have with your health care provider. Document Released: 08/03/2015 Document Revised: 03/26/2016 Document Reviewed: 05/08/2015 Elsevier Interactive Patient Education  2017 [place] Prevention in the Home Falls can cause injuries. They can happen to people of all ages. There are many things you can do to make your home safe and to help prevent falls. What can I do on the outside of my home?  Regularly fix the edges of walkways and driveways and fix any cracks.  Remove anything that might make you trip as you walk through a door, such as a raised step or threshold.  Trim any bushes or trees on the path to your home.  Use bright outdoor lighting.  Clear any walking paths of anything that might make someone trip, such as rocks or tools.  Regularly check to see if handrails are loose or broken. Make sure that both sides of any steps have handrails.  Any raised decks and porches should have guardrails on the edges.  Have any leaves, snow, or ice cleared regularly.  Use sand or salt on walking paths during winter.  Clean up any spills in your garage right away. This includes oil or grease spills. What can I do in the bathroom?  Use night  lights.  Install grab bars by the toilet and in the tub and shower. Do not use towel bars as grab bars.  Use non-skid mats or decals in the tub or shower.  If you need to sit down in the shower, use a plastic, non-slip stool.  Keep the floor dry. Clean up any water that spills on the floor as soon as it happens.  Remove soap buildup in the tub or shower regularly.  Attach bath mats securely with double-sided non-slip rug tape.  Do not have throw rugs and other things on the floor that can make you trip. What can I do in the bedroom?  Use night lights.  Make sure that you have a light by your bed that is easy to reach.  Do not use any sheets or blankets that are too big for your bed. They should not hang down onto the floor.  Have a firm chair that has side arms. You can use this for support while you get dressed.  Do not have throw rugs and other things on the floor that can make you trip. What can I do in the kitchen?  Clean up  any spills right away.  Avoid walking on wet floors.  Keep items that you use a lot in easy-to-reach places.  If you need to reach something above you, use a strong step stool that has a grab bar.  Keep electrical cords out of the way.  Do not use floor polish or wax that makes floors slippery. If you must use wax, use non-skid floor wax.  Do not have throw rugs and other things on the floor that can make you trip. What can I do with my stairs?  Do not leave any items on the stairs.  Make sure that there are handrails on both sides of the stairs and use them. Fix handrails that are broken or loose. Make sure that handrails are as long as the stairways.  Check any carpeting to make sure that it is firmly attached to the stairs. Fix any carpet that is loose or worn.  Avoid having throw rugs at the top or bottom of the stairs. If you do have throw rugs, attach them to the floor with carpet tape.  Make sure that you have a light switch at the  top of the stairs and the bottom of the stairs. If you do not have them, ask someone to add them for you. What else can I do to help prevent falls?  Wear shoes that:  Do not have high heels.  Have rubber bottoms.  Are comfortable and fit you well.  Are closed at the toe. Do not wear sandals.  If you use a stepladder:  Make sure that it is fully opened. Do not climb a closed stepladder.  Make sure that both sides of the stepladder are locked into place.  Ask someone to hold it for you, if possible.  Clearly mark and make sure that you can see:  Any grab bars or handrails.  First and last steps.  Where the edge of each step is.  Use tools that help you move around (mobility aids) if they are needed. These include:  Canes.  Walkers.  Scooters.  Crutches.  Turn on the lights when you go into a dark area. Replace any light bulbs as soon as they burn out.  Set up your furniture so you have a clear path. Avoid moving your furniture around.  If any of your floors are uneven, fix them.  If there are any pets around you, be aware of where they are.  Review your medicines with your doctor. Some medicines can make you feel dizzy. This can increase your chance of falling. Ask your doctor what other things that you can do to help prevent falls. This information is not intended to replace advice given to you by your health care provider. Make sure you discuss any questions you have with your health care provider. Document Released: 05/03/2009 Document Revised: 12/13/2015 Document Reviewed: 08/11/2014 Elsevier Interactive Patient Education  2017 [employer].

## 2020-11-28 ENCOUNTER — Other Ambulatory Visit: Payer: Self-pay | Admitting: Internal Medicine

## 2020-11-28 DIAGNOSIS — Z1231 Encounter for screening mammogram for malignant neoplasm of breast: Secondary | ICD-10-CM

## 2020-11-29 DIAGNOSIS — U071 COVID-19: Secondary | ICD-10-CM | POA: Diagnosis not present

## 2020-11-29 DIAGNOSIS — Z03818 Encounter for observation for suspected exposure to other biological agents ruled out: Secondary | ICD-10-CM | POA: Diagnosis not present

## 2020-12-23 DIAGNOSIS — M5441 Lumbago with sciatica, right side: Secondary | ICD-10-CM | POA: Diagnosis not present

## 2020-12-23 DIAGNOSIS — M545 Low back pain, unspecified: Secondary | ICD-10-CM | POA: Diagnosis not present

## 2021-01-04 ENCOUNTER — Ambulatory Visit
Admission: RE | Admit: 2021-01-04 | Discharge: 2021-01-04 | Disposition: A | Discharge status: Home / Self Care | Payer: PPO | Source: Ambulatory Visit | Attending: Internal Medicine | Admitting: Internal Medicine

## 2021-01-04 ENCOUNTER — Other Ambulatory Visit: Payer: Self-pay

## 2021-01-04 DIAGNOSIS — Z1231 Encounter for screening mammogram for malignant neoplasm of breast: Secondary | ICD-10-CM | POA: Insufficient documentation

## 2021-01-07 ENCOUNTER — Telehealth: Payer: Self-pay | Admitting: Internal Medicine

## 2021-01-07 NOTE — Telephone Encounter (Signed)
PT called to speak to Dr.Tullo Nurse would not state what it was for. PT can be reach at [phone].

## 2021-01-08 DIAGNOSIS — M545 Low back pain, unspecified: Secondary | ICD-10-CM | POA: Diagnosis not present

## 2021-01-08 DIAGNOSIS — M47896 Other spondylosis, lumbar region: Secondary | ICD-10-CM | POA: Diagnosis not present

## 2021-01-08 DIAGNOSIS — M5416 Radiculopathy, lumbar region: Secondary | ICD-10-CM | POA: Diagnosis not present

## 2021-01-08 NOTE — Telephone Encounter (Signed)
I called patient & she was asking to be seen sooner due to right sided sciatic pain. She was seen 6/5 at [facility] walk-in & given prednisone as well as metaxalone. She is out of both & they helped for awhile. She was asking to be seen sooner, but I advised nothing available. Not sure if you were willing to send refill of the metaxalone 800mg  or had any advice? Maybe [facility]. She sees you 7/1.

## 2021-01-08 NOTE — Telephone Encounter (Signed)
I called patient & advised she see [facility] walk-in. I gave patient's directions &  phone number as well as advised her of hours.

## 2021-01-18 ENCOUNTER — Encounter: Payer: Self-pay | Admitting: Internal Medicine

## 2021-01-18 ENCOUNTER — Ambulatory Visit (INDEPENDENT_AMBULATORY_CARE_PROVIDER_SITE_OTHER): Payer: PPO | Admitting: Internal Medicine

## 2021-01-18 ENCOUNTER — Other Ambulatory Visit: Payer: Self-pay

## 2021-01-18 VITALS — BP 142/76 | HR 44 | Temp 96.9°F | Resp 15 | Ht 65.0 in | Wt 140.0 lb

## 2021-01-18 DIAGNOSIS — M545 Low back pain, unspecified: Secondary | ICD-10-CM | POA: Diagnosis not present

## 2021-01-18 DIAGNOSIS — I10 Essential (primary) hypertension: Secondary | ICD-10-CM | POA: Diagnosis not present

## 2021-01-18 DIAGNOSIS — M85832 Other specified disorders of bone density and structure, left forearm: Secondary | ICD-10-CM

## 2021-01-18 LAB — COMPREHENSIVE METABOLIC PANEL
ALT: 17 U/L (ref 0–35)
AST: 22 U/L (ref 0–37)
Albumin: 4.5 g/dL (ref 3.5–5.2)
Alkaline Phosphatase: 49 U/L (ref 39–117)
BUN: 13 mg/dL (ref 6–23)
CO2: 31 mEq/L (ref 19–32)
Calcium: 10.7 mg/dL — ABNORMAL HIGH (ref 8.4–10.5)
Chloride: 105 mEq/L (ref 96–112)
Creatinine, Ser: 0.86 mg/dL (ref 0.40–1.20)
GFR: 60.44 mL/min (ref >60.00)
Glucose, Bld: 89 mg/dL (ref 70–99)
Potassium: 4.2 mEq/L (ref 3.5–5.1)
Sodium: 142 mEq/L (ref 135–145)
Total Bilirubin: 0.9 mg/dL (ref 0.2–1.2)
Total Protein: 6.7 g/dL (ref 6.0–8.3)

## 2021-01-18 LAB — MICROALBUMIN / CREATININE URINE RATIO
Creatinine,U: 110.7 mg/dL
Microalb Creat Ratio: 0.7 mg/g (ref 0.0–30.0)
Microalb, Ur: 0.7 mg/dL (ref 0.0–1.9)

## 2021-01-18 LAB — VITAMIN D 25 HYDROXY (VIT D DEFICIENCY, FRACTURES): VITD: 48.84 ng/mL (ref 30.00–100.00)

## 2021-01-18 MED ORDER — AMLODIPINE BESYLATE 2.5 MG PO TABS: 2.5000 mg | ORAL_TABLET | Freq: Every day | ORAL | 0 refills | Status: DC

## 2021-01-18 NOTE — Progress Notes (Signed)
Patient ID: Bridget [name], female    DOB: Dec 01, 1930  Age: 87 y.o. MRN: [id number]  The patient is here for follow up and management of other chronic and acute problems.   The risk factors are reflected in the social history.  The roster of all physicians providing medical care to patient - is listed in the Snapshot section of the chart.  Activities of daily living:  The patient is 100% independent in all ADLs: dressing, toileting, feeding as well as independent mobility  Home safety : The patient has smoke detectors in the home. They wear seatbelts.  There are no firearms at home. There is no violence in the home.   There is no risks for hepatitis, STDs or HIV. There is no   history of blood transfusion. They have no travel history to infectious disease endemic areas of the world.  The patient has seen their dentist in the last six month. They have seen their eye doctor in the last year.  She denies  hearing difficulty with regard to whispered voices and some television programs.  They have deferred audiologic testing in the last year.  They do not  have excessive sun exposure. Discussed the need for sun protection: hats, long sleeves and use of sunscreen if there is significant sun exposure.   Diet: the importance of a healthy diet is discussed. They do have a healthy diet.  The benefits of regular aerobic exercise were discussed. She walks 4 times per week ,  20 minutes.   Depression screen: there are no signs or vegative symptoms of depression- irritability, change in appetite, anhedonia, sadness/tearfullness.  Cognitive assessment: the patient manages all their financial and personal affairs and is actively engaged. They could relate day,date,year and events; recalled 2/3 objects at 3 minutes; performed clock-face test normally.  The following portions of the patient's history were reviewed and updated as appropriate: allergies, current medications, past family history, past medical  history,  past surgical history, past social history  and problem list.  Visual acuity was not assessed per patient preference since she has regular follow up with her ophthalmologist. Hearing and body mass index were assessed and reviewed.   During the course of the visit the patient was educated and counseled about appropriate screening and preventive services including : fall prevention , diabetes screening, nutrition counseling, colorectal cancer screening, and recommended immunizations.    CC: The primary encounter diagnosis was Elevated blood pressure reading in office with diagnosis of hypertension. Diagnoses of Osteopenia of left forearm and Low back pain of over 3 months duration were also pertinent to this visit.  Treated on June 5 by [facility] clinic for low back pain with prednisone and MR.   Did not help.  Saw [facility] , now TAKING meloxicam    bid 7.5 mg for several weeks ,  prescribed by Dr Dema Severin ?  And taking 1000 mg And doing exercises    History Lovey has a past medical history of Diverticulosis of colon without diverticulitis and GERD (gastroesophageal reflux disease).   She has a past surgical history that includes Oophorectomy; Abdominal hysterectomy; and Toe Surgery (06/2015).   Her family history includes Cancer in her brother, brother, sister, sister, and sister; Heart failure in her mother.She reports that she has never smoked. She has never used smokeless tobacco. She reports that she does not drink alcohol and does not use drugs.  Outpatient Medications Prior to Visit  Medication Sig Dispense Refill   acetaminophen (TYLENOL) 500  MG tablet Take 1,000 mg by mouth daily as needed.     Calcium Carbonate-Vitamin D 600-200 MG-UNIT TABS Take 1 tablet by mouth daily.       mometasone (ELOCON) 0.1 % cream Apply 1 application topically daily. 45 g 0   Multiple Vitamins-Minerals (CENTRUM SILVER PO) Take 1 tablet by mouth daily.       pantoprazole (PROTONIX) 40 MG tablet  TAKE 1 TABLET BY MOUTH ONCE DAILY 90 tablet 1   vitamin C (ASCORBIC ACID) 500 MG tablet Take 500 mg by mouth daily.     No facility-administered medications prior to visit.    Review of Systems  Patient denies headache, fevers, malaise, unintentional weight loss, skin rash, eye pain, sinus congestion and sinus pain, sore throat, dysphagia,  hemoptysis , cough, dyspnea, wheezing, chest pain, palpitations, orthopnea, edema, abdominal pain, nausea, melena, diarrhea, constipation, flank pain, dysuria, hematuria, urinary  Frequency, nocturia, numbness, tingling, seizures,  Focal weakness, Loss of consciousness,  Tremor, insomnia, depression, anxiety, and suicidal ideation.     Objective:  BP (!) 142/76 (BP Location: Left Arm, Patient Position: Sitting, Cuff Size: Normal)   Pulse (!) 44   Temp (!) 96.9 F (36.1 C) (Temporal)   Resp 15   Ht 5\' 5"  (1.651 m)   Wt 140 lb (63.5 kg)   SpO2 97%   BMI 23.30 kg/m   Physical Exam  General appearance: alert, cooperative and appears stated age Head: Normocephalic, without obvious abnormality, atraumatic Eyes: conjunctivae/corneas clear. PERRL, EOM's intact. Fundi benign. Ears: normal TM's and external ear canals both ears Nose: Nares normal. Septum midline. Mucosa normal. No drainage or sinus tenderness. Throat: lips, mucosa, and tongue normal; teeth and gums normal Neck: no adenopathy, no carotid bruit, no JVD, supple, symmetrical, trachea midline and thyroid not enlarged, symmetric, no tenderness/mass/nodules Lungs: clear to auscultation bilaterally Breasts: normal appearance, no masses or tenderness Heart: regular rate and rhythm, S1, S2 normal, no murmur, click, rub or gallop Abdomen: soft, non-tender; bowel sounds normal; no masses,  no organomegaly Extremities: extremities normal, atraumatic, no cyanosis or edema Pulses: 2+ and symmetric Skin: Skin color, texture, turgor normal. No rashes or lesions Neurologic: Alert and oriented X 3,  normal strength and tone. Normal symmetric reflexes. Normal coordination and gait.      Assessment & Plan:   Problem List Items Addressed This Visit       Unprioritized   Elevated blood pressure reading in office with diagnosis of hypertension - Primary    She has no history of hypertension but has had several elevated readings.  She has been asked to check her pressures at home and submit readings for evaluation. Renal function will be checked today       Relevant Medications   amLODipine (NORVASC) 2.5 MG tablet   Other Relevant Orders   Comprehensive metabolic panel (Completed)   Microalbumin / creatinine urine ratio (Completed)   Low back pain of over 3 months duration    Back extension exercises demonstrated in office.  She has no radicular symptoms       Osteopenia   Relevant Orders   VITAMIN D 25 Hydroxy (Vit-D Deficiency, Fractures) (Completed)   I spent 40 mintutes dedicated to the care of this patient on the date of this encounter to include pre-visit review of her medical history,  in person time with the patient , and post visit ordering of testing and therapeutics.   I am having Rosmary G. Ress start on amLODipine. I am also having  her maintain her Multiple Vitamins-Minerals (CENTRUM SILVER PO), Calcium Carbonate-Vitamin D, acetaminophen, mometasone, pantoprazole, and vitamin C.  Meds ordered this encounter  Medications   amLODipine (NORVASC) 2.5 MG tablet    Sig: Take 1 tablet (2.5 mg total) by mouth daily.    Dispense:  90 tablet    Refill:  0    There are no discontinued medications.  Follow-up: Return in about 2 weeks (around 02/01/2021).   Crecencio Mc, MD

## 2021-01-18 NOTE — Patient Instructions (Addendum)
For your back pain :   You should reduce your meloxicam to 7.5 mg daily.  You can add up to 2000 mg of acetominophen (tylenol) every day safely  In divided doses (500 mg every 6 hours  Or 1000 mg every 12 hours.)   For your osteopenia (thin bones) :   Continue taking 1200 mg calcium daily ,  plus you may need dditional Vitamin D (amount will depend on your level we are checking  Walking daily for exercise    Here is the exercise for your back   Your blood pressure was high today.  I am starting you on amlodipine  to lower it. Please return in 2 weeks for a blood pressure check    Exercise: (supported back extension, standing)  Stand against a counter or sofa with your buttocks resting on the edge and your hands on the edge as well on either side of your back  Slowly lean backwards,  Bending from the waist, until you feel slight discomfort. Restore yourself to vertical position (up straight) Repeat the back extension 10 times , each time extending a little farther).  What should you expect? The pain should recede from the calf/thigh/buttocks but may localize to the lower back  If it does not,  Or if it makes the leg pain worse, STOP doing it.   If it results in improvement,  Repeat the exercise every 2-3 hours while awake and STOP THE OTHER EXERCISES that do the opposite motion (back flexion)

## 2021-01-21 DIAGNOSIS — M545 Low back pain, unspecified: Secondary | ICD-10-CM | POA: Insufficient documentation

## 2021-01-21 NOTE — Assessment & Plan Note (Signed)
Back extension exercises demonstrated in office.  She has no radicular symptoms

## 2021-01-21 NOTE — Assessment & Plan Note (Signed)
She has no history of hypertension but has had several elevated readings.  She has been asked to check her pressures at home and submit readings for evaluation. Renal function will be checked today

## 2021-01-22 DIAGNOSIS — M47896 Other spondylosis, lumbar region: Secondary | ICD-10-CM | POA: Diagnosis not present

## 2021-01-22 DIAGNOSIS — M545 Low back pain, unspecified: Secondary | ICD-10-CM | POA: Diagnosis not present

## 2021-02-06 ENCOUNTER — Ambulatory Visit (INDEPENDENT_AMBULATORY_CARE_PROVIDER_SITE_OTHER): Payer: PPO

## 2021-02-06 ENCOUNTER — Other Ambulatory Visit: Payer: Self-pay

## 2021-02-06 VITALS — BP 116/76 | HR 70 | Temp 97.9°F | Resp 20 | Ht 65.0 in | Wt 137.2 lb

## 2021-02-06 DIAGNOSIS — Z0130 Encounter for examination of blood pressure without abnormal findings: Secondary | ICD-10-CM | POA: Diagnosis not present

## 2021-02-06 NOTE — Progress Notes (Signed)
Patient here for nurse visit BP check per order from Dr. Derrel Nip on 01/18/21.   Patient reports compliance with prescribed BP medications: yes  BP Medication the patient is taking: Amlodpine  BP medication the patient is not taking:  Last dose of BP medication: Pt last took medication the morning of 02/06/21  BP Readings from Last 3 Encounters:  02/06/21 116/76  01/18/21 (!) 142/76  06/25/20 134/70   Pulse Readings from Last 3 Encounters:  02/06/21 70  01/18/21 (!) 44  06/25/20 68   Pt brought in her Bp machine and the reading was 126/77 with a pulse of 67  Pt brought in a blood pressure log. It has been placed in Dr. Lupita Dawn review folder.   Any Headaches? Dizziness? Light Headedness? Pt reports no symptoms.   [place], CMA

## 2021-02-07 DIAGNOSIS — M545 Low back pain, unspecified: Secondary | ICD-10-CM | POA: Diagnosis not present

## 2021-02-13 ENCOUNTER — Telehealth: Payer: Self-pay | Admitting: Internal Medicine

## 2021-02-13 NOTE — Telephone Encounter (Signed)
Ome BP readings are fine.  Continue current medications

## 2021-02-13 NOTE — Telephone Encounter (Signed)
Spoke with pt to let her know that her bp readings a good and to continue taking current medications.

## 2021-02-15 ENCOUNTER — Other Ambulatory Visit: Payer: Self-pay | Admitting: Internal Medicine

## 2021-03-01 DIAGNOSIS — G5603 Carpal tunnel syndrome, bilateral upper limbs: Secondary | ICD-10-CM | POA: Diagnosis not present

## 2021-03-14 DIAGNOSIS — G5603 Carpal tunnel syndrome, bilateral upper limbs: Secondary | ICD-10-CM | POA: Diagnosis not present

## 2021-04-02 DIAGNOSIS — G5601 Carpal tunnel syndrome, right upper limb: Secondary | ICD-10-CM | POA: Diagnosis not present

## 2021-04-08 DIAGNOSIS — Z961 Presence of intraocular lens: Secondary | ICD-10-CM | POA: Diagnosis not present

## 2021-04-11 ENCOUNTER — Telehealth: Payer: Self-pay | Admitting: Internal Medicine

## 2021-04-11 MED ORDER — AMLODIPINE BESYLATE 2.5 MG PO TABS
2.5000 mg | ORAL_TABLET | Freq: Every day | ORAL | 0 refills | Status: DC
Start: 2021-04-11 — End: 2021-07-16

## 2021-04-11 NOTE — Telephone Encounter (Signed)
Pt was advised that she needs to continue taking her blood pressure. Pt stated that her readings are ranging between 683-729 systolic.

## 2021-04-11 NOTE — Telephone Encounter (Signed)
Patient states she was placed on blood pressure medication. She states she only has a few pills left and wanted to know if she should continue? If so she will need a REFILL sent in.   Pended for your approval or denial.

## 2021-05-14 ENCOUNTER — Other Ambulatory Visit: Payer: Self-pay | Admitting: Internal Medicine

## 2021-07-13 ENCOUNTER — Other Ambulatory Visit: Payer: Self-pay | Admitting: Internal Medicine

## 2021-07-16 ENCOUNTER — Other Ambulatory Visit: Payer: Self-pay | Admitting: Internal Medicine

## 2021-07-23 ENCOUNTER — Telehealth: Payer: Self-pay | Admitting: Internal Medicine

## 2021-07-23 NOTE — Telephone Encounter (Signed)
LMTCB

## 2021-07-23 NOTE — Telephone Encounter (Signed)
She can use the higher dose vitamin D supplements but reduce frequency to then ONCE A WEEl,  NOT DAILY

## 2021-07-23 NOTE — Telephone Encounter (Signed)
Pt returning call

## 2021-07-23 NOTE — Telephone Encounter (Signed)
Pt was informed that it is okay for her to take the dose of vitamin d that she picked up but to make sure she only takes it once weekly rather than every day. Pt stated that she was going to try to take it back but if not she would take it once a week.

## 2021-07-23 NOTE — Telephone Encounter (Signed)
Pt called in wanting to know if the vitamin D3 pills that has 10,000 ICU is ok to take because she has been taking 1,000 ICU. Pt stated she picked these pills up accidentally

## 2021-08-13 ENCOUNTER — Other Ambulatory Visit: Payer: Self-pay | Admitting: Internal Medicine

## 2021-10-13 ENCOUNTER — Other Ambulatory Visit: Payer: Self-pay | Admitting: Internal Medicine

## 2021-11-04 ENCOUNTER — Other Ambulatory Visit: Payer: Self-pay | Admitting: Internal Medicine

## 2021-11-26 ENCOUNTER — Ambulatory Visit (INDEPENDENT_AMBULATORY_CARE_PROVIDER_SITE_OTHER): Payer: PPO

## 2021-11-26 VITALS — Ht 65.0 in | Wt 137.0 lb

## 2021-11-26 DIAGNOSIS — Z1231 Encounter for screening mammogram for malignant neoplasm of breast: Secondary | ICD-10-CM | POA: Diagnosis not present

## 2021-11-26 DIAGNOSIS — Z0000 Encounter for general adult medical examination without abnormal findings: Secondary | ICD-10-CM | POA: Diagnosis not present

## 2021-11-26 NOTE — Patient Instructions (Addendum)
?Bridget [name] , ?Thank you for taking time to come for your Medicare Wellness Visit. I appreciate your ongoing commitment to your health goals. Please review the following plan we discussed and let me know if I can assist you in the future.  ? ?These are the goals we discussed: ? Goals   ? ?  Maintain Healthy Lifestyle   ?  Stay active ?Healthy diet ?Stay hydrated ?  ? ?  ?  ?This is a list of the screening recommended for you and due dates:  ?Health Maintenance  ?Topic Date Due  ? COVID-19 Vaccine (5 - Booster for Pfizer series) 12/12/2021*  ? Zoster (Shingles) Vaccine (1 of 2) 01/18/2022*  ? Tetanus Vaccine  01/15/2027  ? Pneumonia Vaccine  Completed  ? DEXA scan (bone density measurement)  Completed  ? HPV Vaccine  Aged Out  ? Flu Shot  Discontinued  ?*Topic was postponed. The date shown is not the original due date.  ?  ?Mammogram ?A mammogram is an X-ray of the breasts. This procedure can screen for and detect any changes that may indicate breast cancer. Mammograms are regularly done beginning at age 59 for women with average risk. A man may have a mammogram if he has a lump or swelling in his breast tissue. ?A mammogram can also identify other changes and variations in the breast, such as: ?Inflammation of the breast tissue (mastitis). ?An infected area that contains a collection of pus (abscess). ?A fluid-filled sac (cyst). ?Tumors that are not cancerous (benign). ?Fibrocystic changes. This is when breast tissue becomes denser and can make the tissue feel rope-like or uneven under the skin. ?Women at higher risk for breast cancer need earlier and more comprehensive screening for abnormal changes. Breast tomosynthesis, or three-dimensional (3D) mammography, and digital breast tomosynthesis are advanced forms of imaging that create 3D pictures of the breasts. ?Tell a health care provider: ?About any allergies you have. ?If you have breast implants. ?If you have had previous breast disease, biopsy, or  surgery. ?If you have a family history of breast cancer. ?If you are breastfeeding. ?Whether you are pregnant or may be pregnant. ?What are the risks? ?Generally, this is a safe procedure. However, problems may occur, including: ?Exposure to radiation. Radiation levels are very low with this test. ?The need for more tests. ?The mammogram fails to detect certain cancers or the results are misinterpreted. ?Difficulty with detecting breast cancer in women with dense breasts. ?What happens before the procedure? ?Schedule your test about 1-2 weeks after your menstrual period if you are menstruating. This is usually when your breasts are the least tender. ?If you have had a mammogram done at a different facility in the past, get the mammogram X-rays or have them sent to your current exam facility. The new and old images will be compared. ?Wash your breasts and underarms on the day of the test. ?Do not wear deodorants, perfumes, lotions, or powders anywhere on your body on the day of the test. ?Remove any jewelry from your neck. ?Wear clothes that you can change into and out of easily. ?What happens during the procedure? ? ?You will undress from the waist up and put on a gown that opens in the front. ?You will stand in front of the X-ray machine. ?Each breast will be placed between two plastic or glass plates. The plates will compress your breast for a few seconds. Try to stay as relaxed as possible. This procedure does not cause any harm to your breasts.  Any discomfort you feel will be very brief. ?X-rays will be taken from different angles of each breast. ?The procedure may vary among health care providers and hospitals. ?What can I expect after the procedure? ?The mammogram will be examined by a specialist (radiologist). ?You may need to repeat certain parts of the test, depending on the quality of the images. This is done if the radiologist needs a better view of the breast tissue. ?You may resume your normal  activities. ?It is up to you to get the results of your procedure. Ask your health care provider, or the department that is doing the procedure, when your results will be ready. ?Summary ?A mammogram is an X-ray of the breasts. This procedure can screen for and detect any changes that may indicate breast cancer. ?A man may have a mammogram if he has a lump or swelling in his breast tissue. ?If you have had a mammogram done at a different facility in the past, get the mammogram X-rays or have them sent to your current exam facility in order to compare them. ?Schedule your test about 1-2 weeks after your menstrual period if you are menstruating. ?Ask when your test results will be ready. Make sure you get your test results. ?This information is not intended to replace advice given to you by your health care provider. Make sure you discuss any questions you have with your health care provider. ?Document Revised: 03/20/2021 Document Reviewed: 05/07/2020 ?Elsevier Patient Education ? [name]. ? ?

## 2021-11-26 NOTE — Progress Notes (Addendum)
Subjective:   Bridget [name] is a 88 y.o. female who presents for Medicare Annual (Subsequent) preventive examination.  Review of Systems    No ROS.  Medicare Wellness Virtual Visit.  Visual/audio telehealth visit, UTA vital signs.   See social history for additional risk factors.   Cardiac Risk Factors include: advanced age (>8men, >26 women);hypertension     Objective:    Today's Vitals   11/26/21 0901  Weight: 137 lb (62.1 kg)  Height: 5\' 5"  (1.651 m)   Body mass index is 22.8 kg/m.     11/23/2020   10:00 AM 10/29/2017   11:41 AM 09/25/2017   10:31 AM 07/25/2015    9:59 AM 04/09/2015    1:23 PM  Advanced Directives  Does Patient Have a Medical Advance Directive? Yes No No Yes Yes  Type of Advance Directive Living will   Healthcare Power of [place];Living will Living will  Does patient want to make changes to medical advance directive? No - Patient declined   No - Patient declined No - Patient declined  Copy of Healthcare Power of Attorney in Chart?    No - copy requested   Would patient like information on creating a medical advance directive?  No - Patient declined      Current Medications (verified) Outpatient Encounter Medications as of 11/26/2021  Medication Sig   acetaminophen (TYLENOL) 500 MG tablet Take 1,000 mg by mouth daily as needed.   amLODipine (NORVASC) 2.5 MG tablet TAKE 1 TABLET BY MOUTH ONCE DAILY   Calcium Carbonate-Vitamin D 600-200 MG-UNIT TABS Take 1 tablet by mouth daily.     Multiple Vitamins-Minerals (CENTRUM SILVER PO) Take 1 tablet by mouth daily.     pantoprazole (PROTONIX) 40 MG tablet TAKE 1 TABLET BY MOUTH ONCE DAILY   vitamin C (ASCORBIC ACID) 500 MG tablet Take 500 mg by mouth daily.   [DISCONTINUED] mometasone (ELOCON) 0.1 % cream Apply 1 application topically daily.   No facility-administered encounter medications on file as of 11/26/2021.    Allergies (verified) Sulfa antibiotics   History: Past Medical History:  Diagnosis Date    Diverticulosis of colon without diverticulitis    GERD (gastroesophageal reflux disease)    Past Surgical History:  Procedure Laterality Date   ABDOMINAL HYSTERECTOMY     Not due to cancer, due to fibroids, BSO   OOPHORECTOMY     Bilateral   TOE SURGERY  06/2015   R Big toe Ingrown toenail removed     Family History  Problem Relation Age of Onset   Heart failure Mother        Massive MI   Cancer Brother        prostate   Cancer Sister        colon   Cancer Sister        lung   Cancer Sister        bone   Cancer Brother        lung (smoker)   Breast cancer Neg Hx    Social History   Socioeconomic History   Marital status: Widowed    Spouse name: Not on file   Number of children: Not on file   Years of education: Not on file   Highest education level: Not on file  Occupational History   Occupation: Part-Time    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  Tobacco Use   Smoking status: Never   Smokeless tobacco: Never  Substance and Sexual Activity  Alcohol use: No   Drug use: No   Sexual activity: Not Currently  Other Topics Concern   Not on file  Social History Narrative   Not on file   Social Determinants of Health   Financial Resource Strain: Low Risk    Difficulty of Paying Living Expenses: Not hard at all  Food Insecurity: No Food Insecurity   Worried About Programme researcher, broadcasting/film/video in the Last Year: Never true   Ran Out of Food in the Last Year: Never true  Transportation Needs: No Transportation Needs   Lack of Transportation (Medical): No   Lack of Transportation (Non-Medical): No  Physical Activity: Insufficiently Active   Days of Exercise per Week: 2 days   Minutes of Exercise per Session: 30 min  Stress: No Stress Concern Present   Feeling of Stress : Not at all  Social Connections: Unknown   Frequency of Communication with Friends and Family: More than three times a week   Frequency of Social Gatherings with Friends and Family: More than  three times a week   Attends Religious Services: 1 to 4 times per year   Active Member of [employer] or Organizations: Yes   Attends Banker Meetings: Not on file   Marital Status: Not on file    Tobacco Counseling Counseling given: Not Answered   Clinical Intake:  Pre-visit preparation completed: Yes        Diabetes: No  How often do you need to have someone help you when you read instructions, pamphlets, or other written materials from your doctor or pharmacy?: 1 - Never  Interpreter Needed?: No      Activities of Daily Living    11/26/2021    9:09 AM  In your present state of health, do you have any difficulty performing the following activities:  Hearing? 0  Vision? 0  Difficulty concentrating or making decisions? 0  Walking or climbing stairs? 0  Dressing or bathing? 0  Doing errands, shopping? 0  Preparing Food and eating ? N  Using the Toilet? N  In the past six months, have you accidently leaked urine? Y  Comment Stress incontinence. Managed with daily liner.  Do you have problems with loss of bowel control? N  Managing your Medications? N  Managing your Finances? N  Housekeeping or managing your Housekeeping? N    Patient Care Team: Sherlene Shams, MD as PCP - General (Internal Medicine)  Indicate any recent Medical Services you may have received from other than [facility] providers in the past year (date may be approximate).     Assessment:   This is a routine wellness examination for Bridget [name].  Virtual Visit via Telephone Note  I connected with  Merin Burnett Harry on 11/26/21 at  9:00 AM EDT by telephone and verified that I am speaking with the correct person using two identifiers.  Persons participating in the virtual visit: patient/Nurse Health Advisor   I discussed the limitations of performing an evaluation and management service by telehealth. The patient expressed understanding and agreed to proceed. We continued and completed visit with  audio only. Some vital signs may be absent or patient reported.   Hearing/Vision screen Hearing Screening - Comments:: Patient is able to hear conversational tones without difficulty. No issues reported. Vision Screening - Comments:: Followed by [facility]  Wears corrective lenses when driving Cataract extraction, bilateral  They have regular follow up with the ophthalmologist  Dietary issues and exercise activities discussed: Current Exercise Habits:  Home exercise routine, Type of exercise: walking, Intensity: Mild Healthy diet Fair water intake    Goals Addressed             This Visit's Progress    Maintain Healthy Lifestyle       Stay active Healthy diet Stay hydrated       Depression Screen    11/26/2021    9:17 AM 01/18/2021    9:04 AM 11/23/2020    9:57 AM 06/25/2020    3:11 PM 10/29/2017   11:26 AM 03/05/2017    8:37 AM 10/12/2015   10:05 AM  PHQ 2/9 Scores  PHQ - 2 Score 0 0 0 0 0 0 0  PHQ- 9 Score  2         Fall Risk    11/26/2021    9:16 AM 01/18/2021    9:04 AM 11/23/2020   12:33 PM 06/25/2020    2:57 PM 12/22/2019   10:44 AM  Fall Risk   Falls in the past year? 0 0 0 0 0  Number falls in past yr: 0  0    Injury with Fall?   0    Follow up Falls evaluation completed Falls evaluation completed Falls evaluation completed Falls evaluation completed Falls evaluation completed   FALL RISK PREVENTION PERTAINING TO THE HOME: Home free of loose throw rugs in walkways, pet beds, electrical cords, etc? Yes  Adequate lighting in your home to reduce risk of falls? Yes   ASSISTIVE DEVICES UTILIZED TO PREVENT FALLS: Life alert? No  Use of a cane, walker or w/c? Yes , as needed when walking to the mailbox.  TIMED UP AND GO: Was the test performed? No .   Cognitive Function: Patient is alert and oriented x3.  Enjoys word search, reading and other brain health activities.     07/25/2015   10:09 AM  MMSE - Mini Mental State Exam  Orientation to time 5   Orientation to Place 5  Registration 3  Attention/ Calculation 5  Recall 3  Language- name 2 objects 2  Language- repeat 1  Language- follow 3 step command 3  Language- read & follow direction 1  Write a sentence 1  Copy design 1  Total score 30        11/26/2021    9:10 AM 10/29/2017   11:43 AM  6CIT Screen  What Year? 0 points 0 points  What month? 0 points 0 points  What time? 0 points 0 points  Count back from 20 0 points 0 points  Months in reverse 0 points 0 points  Repeat phrase  0 points  Total Score  0 points    Immunizations Immunization History  Administered Date(s) Administered   PFIZER(Purple Top)SARS-COV-2 Vaccination 08/22/2019, 09/12/2019, 06/06/2020, 01/09/2021   Pneumococcal Conjugate-13 10/12/2015   Pneumococcal Polysaccharide-23 02/25/2012, 08/26/2018   Td 01/14/2017    Shingrix Completed?: No.    Education has been provided regarding the importance of this vaccine. Patient has been advised to call insurance company to determine out of pocket expense if they have not yet received this vaccine. Advised may also receive vaccine at local pharmacy or Health Dept. Verbalized acceptance and understanding.  Screening Tests Health Maintenance  Topic Date Due   COVID-19 Vaccine (5 - Booster for Pfizer series) 12/12/2021 (Originally 03/06/2021)   Zoster Vaccines- Shingrix (1 of 2) 01/18/2022 (Originally 12/01/1982)   TETANUS/TDAP  01/15/2027   Pneumonia Vaccine 7+ Years old  Completed   DEXA SCAN  Completed   HPV VACCINES  Aged Out   INFLUENZA VACCINE  Discontinued   Health Maintenance There are no preventive care reminders to display for this patient.  Lung Cancer Screening: (Low Dose CT Chest recommended if Age 69-80 years, 30 pack-year currently smoking OR have quit w/in 15years.) does not qualify.   Hepatitis C Screening: does not qualify.  Vision Screening: Recommended annual ophthalmology exams for early detection of glaucoma and other disorders of  the eye.  Dental Screening: Recommended annual dental exams for proper oral hygiene  Community Resource Referral / Chronic Care Management: CRR required this visit?  No   CCM required this visit?  No      Plan:   Keep all routine maintenance appointments.   I have personally reviewed and noted the following in the patient's chart:   Medical and social history Use of alcohol, tobacco or illicit drugs  Current medications and supplements including opioid prescriptions.  Functional ability and status Nutritional status Physical activity Advanced directives List of other physicians Hospitalizations, surgeries, and ER visits in previous 12 months Vitals Screenings to include cognitive, depression, and falls Referrals and appointments  In addition, I have reviewed and discussed with patient certain preventive protocols, quality metrics, and best practice recommendations. A written personalized care plan for preventive services as well as general preventive health recommendations were provided to patient.     OBrien-Blaney, Briya Lookabaugh L, LPN   07/26/1094     I have reviewed the above information and agree with above.   Duncan Dull, MD

## 2022-01-06 ENCOUNTER — Other Ambulatory Visit: Payer: Self-pay | Admitting: Family

## 2022-01-07 ENCOUNTER — Ambulatory Visit
Admission: RE | Admit: 2022-01-07 | Discharge: 2022-01-07 | Disposition: A | Discharge status: Home / Self Care | Payer: PPO | Source: Ambulatory Visit | Attending: Internal Medicine | Admitting: Internal Medicine

## 2022-01-07 DIAGNOSIS — Z1231 Encounter for screening mammogram for malignant neoplasm of breast: Secondary | ICD-10-CM | POA: Diagnosis not present

## 2022-01-22 DIAGNOSIS — S90562A Insect bite (nonvenomous), left ankle, initial encounter: Secondary | ICD-10-CM | POA: Diagnosis not present

## 2022-01-22 DIAGNOSIS — W57XXXA Bitten or stung by nonvenomous insect and other nonvenomous arthropods, initial encounter: Secondary | ICD-10-CM | POA: Diagnosis not present

## 2022-02-03 ENCOUNTER — Encounter: Payer: Self-pay | Admitting: Internal Medicine

## 2022-02-03 ENCOUNTER — Ambulatory Visit (INDEPENDENT_AMBULATORY_CARE_PROVIDER_SITE_OTHER): Payer: PPO | Admitting: Internal Medicine

## 2022-02-03 VITALS — BP 120/80 | HR 73 | Temp 98.2°F | Ht 65.0 in | Wt 137.6 lb

## 2022-02-03 DIAGNOSIS — Z780 Asymptomatic menopausal state: Secondary | ICD-10-CM

## 2022-02-03 DIAGNOSIS — M85832 Other specified disorders of bone density and structure, left forearm: Secondary | ICD-10-CM | POA: Diagnosis not present

## 2022-02-03 DIAGNOSIS — I10 Essential (primary) hypertension: Secondary | ICD-10-CM | POA: Diagnosis not present

## 2022-02-03 DIAGNOSIS — Z789 Other specified health status: Secondary | ICD-10-CM

## 2022-02-03 DIAGNOSIS — E559 Vitamin D deficiency, unspecified: Secondary | ICD-10-CM | POA: Diagnosis not present

## 2022-02-03 DIAGNOSIS — E042 Nontoxic multinodular goiter: Secondary | ICD-10-CM | POA: Diagnosis not present

## 2022-02-03 DIAGNOSIS — M8580 Other specified disorders of bone density and structure, unspecified site: Secondary | ICD-10-CM

## 2022-02-03 DIAGNOSIS — Z1231 Encounter for screening mammogram for malignant neoplasm of breast: Secondary | ICD-10-CM

## 2022-02-03 LAB — COMPREHENSIVE METABOLIC PANEL
ALT: 16 U/L (ref 0–35)
AST: 23 U/L (ref 0–37)
Albumin: 4.5 g/dL (ref 3.5–5.2)
Alkaline Phosphatase: 47 U/L (ref 39–117)
BUN: 15 mg/dL (ref 6–23)
CO2: 29 mEq/L (ref 19–32)
Calcium: 11.1 mg/dL — ABNORMAL HIGH (ref 8.4–10.5)
Chloride: 105 mEq/L (ref 96–112)
Creatinine, Ser: 0.76 mg/dL (ref 0.40–1.20)
GFR: 69.59 mL/min (ref >60.00)
Glucose, Bld: 100 mg/dL — ABNORMAL HIGH (ref 70–99)
Potassium: 3.8 mEq/L (ref 3.5–5.1)
Sodium: 142 mEq/L (ref 135–145)
Total Bilirubin: 0.9 mg/dL (ref 0.2–1.2)
Total Protein: 6.8 g/dL (ref 6.0–8.3)

## 2022-02-03 LAB — VITAMIN D 25 HYDROXY (VIT D DEFICIENCY, FRACTURES): VITD: 57.16 ng/mL (ref 30.00–100.00)

## 2022-02-03 LAB — TSH: TSH: 0.62 u[IU]/mL (ref 0.35–5.50)

## 2022-02-03 NOTE — Assessment & Plan Note (Signed)
Repeat DEXA is due .  She is taking calcium and vitamin d

## 2022-02-03 NOTE — Progress Notes (Addendum)
Patient ID: Bridget [name], female    DOB: October 28, 1930  Age: 88 y.o. MRN: [id number]  The patient is here for Follow up and management of other chronic and acute problems.   The risk factors are reflected in the social history.  The roster of all physicians providing medical care to patient - is listed in the Snapshot section of the chart.  Activities of daily living:  The patient is 100% independent in all ADLs: dressing, toileting, feeding as well as independent mobility  Home safety : The patient has smoke detectors in the home. They wear seatbelts.  There are no firearms at home. There is no violence in the home.   There is no risks for hepatitis, STDs or HIV. There is no   history of blood transfusion. They have no travel history to infectious disease endemic areas of the world.  The patient has seen their dentist in the last six month. They have seen their eye doctor in the last year. They admit to slight hearing difficulty with regard to whispered voices and some television programs.  They have deferred audiologic testing in the last year.  They do not  have excessive sun exposure. Discussed the need for sun protection: hats, long sleeves and use of sunscreen if there is significant sun exposure.   Diet: the importance of a healthy diet is discussed. They do have a healthy diet.  The benefits of regular aerobic exercise were discussed. She walks 4 times per week ,  20 minutes.   Depression screen: there are no signs or vegative symptoms of depression- irritability, change in appetite, anhedonia, sadness/tearfullness.  Cognitive assessment: the patient manages all their financial and personal affairs and is actively engaged. They could relate day,date,year and events; recalled 2/3 objects at 3 minutes; performed clock-face test normally.  The following portions of the patient's history were reviewed and updated as appropriate: allergies, current medications, past family history, past  medical history,  past surgical history, past social history  and problem list.  Visual acuity was not assessed per patient preference since she has regular follow up with her ophthalmologist. Hearing and body mass index were assessed and reviewed.   During the course of the visit the patient was educated and counseled about appropr.iate screening and preventive services including : fall prevention , diabetes screening, nutrition counseling, colorectal cancer screening, and recommended immunizations.    CC: The primary encounter diagnosis was Multinodular goiter. Diagnoses of Elevated blood pressure reading in office with diagnosis of hypertension, Vitamin D deficiency, Living will in place, Osteopenia after menopause, Essential hypertension, Osteopenia of left forearm, Breast cancer screening by mammogram, and Hypercalcemia were also pertinent to this visit.  1) bilateral leg pain , constant for the past 2 months .  Starts just below ischial tuberosity bilaterally. Denies back pain . Does not improve with stretching while in bed .  Does not improve with activity, only with tylenol taken daily 500 mg q am   2) has a "bad" right knee.  So cannot soak in tub.   3) END of Life issues.  She Has a living will , and son is her unofficial healthcare POA.  She does not want DNR status   History Bridget [name] has a past medical history of Diverticulosis of colon without diverticulitis and GERD (gastroesophageal reflux disease).   She has a past surgical history that includes Oophorectomy; Abdominal hysterectomy; and Toe Surgery (06/2015).   Her family history includes Cancer in her brother, brother, sister, sister,  and sister; Heart failure in her mother.She reports that she has never smoked. She has never used smokeless tobacco. She reports that she does not drink alcohol and does not use drugs.  Outpatient Medications Prior to Visit  Medication Sig Dispense Refill   acetaminophen (TYLENOL) 500 MG tablet  Take 1,000 mg by mouth daily as needed.     amLODipine (NORVASC) 2.5 MG tablet TAKE 1 TABLET BY MOUTH ONCE DAILY 90 tablet 0   Calcium Carbonate-Vitamin D 600-200 MG-UNIT TABS Take 1 tablet by mouth daily.       Multiple Vitamins-Minerals (CENTRUM SILVER PO) Take 1 tablet by mouth daily.       pantoprazole (PROTONIX) 40 MG tablet TAKE 1 TABLET BY MOUTH ONCE DAILY 90 tablet 1   vitamin C (ASCORBIC ACID) 500 MG tablet Take 500 mg by mouth daily.     No facility-administered medications prior to visit.    Review of Systems  \Patient denies headache, fevers, malaise, unintentional weight loss, skin rash, eye pain, sinus congestion and sinus pain, sore throat, dysphagia,  hemoptysis , cough, dyspnea, wheezing, chest pain, palpitations, orthopnea, edema, abdominal pain, nausea, melena, diarrhea, constipation, flank pain, dysuria, hematuria, urinary  Frequency, nocturia, numbness, tingling, seizures,  Focal weakness, Loss of consciousness,  Tremor, insomnia, depression, anxiety, and suicidal ideation.     Objective:  BP 120/80 (BP Location: Left Arm, Patient Position: Sitting, Cuff Size: Normal)   Pulse 73   Temp 98.2 F (36.8 C) (Oral)   Ht '5\' 5"'$  (1.651 m)   Wt 137 lb 9.6 oz (62.4 kg)   SpO2 97%   BMI 22.90 kg/m   Physical Exam  General appearance: alert, cooperative and appears stated age Ears: normal TM's and external ear canals both ears Throat: lips, mucosa, and tongue normal; teeth and gums normal Neck: no adenopathy, no carotid bruit, supple, symmetrical, trachea midline and thyroid not enlarged, symmetric, no tenderness/mass/nodules Back: symmetric, no curvature. ROM normal. No CVA tenderness. Lungs: clear to auscultation bilaterally Heart: regular rate and rhythm, S1, S2 normal, no murmur, click, rub or gallop Abdomen: soft, non-tender; bowel sounds normal; no masses,  no organomegaly Pulses: 2+ and symmetric Skin: Skin color, texture, turgor normal. No rashes or lesions Lymph  nodes: Cervical, supraclavicular, and axillary nodes normal.  Assessment & Plan:   Problem List Items Addressed This Visit     Multinodular goiter - Primary   Relevant Orders   TSH (Completed)   Hypercalcemia    Repeatedly noted.  Advised to stop calcium and return for PTH in 2 weeks       Relevant Orders   PTH, Intact and Calcium   PTH-Related Peptide   Essential hypertension    Well controlled on low dose amlodipine . Renal function stable, no changes today.      Osteopenia    Repeat DEXA is due .  She is taking calcium and vitamin d      Breast cancer screening by mammogram    She has resumed annual mammograms after perceiving a mass last year on self exam..  Last year's exam and  mammogram was normal. Repeat annual screening ordered.       Living will in place    Reviewed today verbally.  Asked to bring a copy to office.  DNR discussed;  She declines at this time       Other Visit Diagnoses     Vitamin D deficiency       Relevant Orders   VITAMIN D 25  Hydroxy (Vit-D Deficiency, Fractures) (Completed)   Osteopenia after menopause       Relevant Orders   DG Bone Density       I spent 30 minutes dedicated to the care of this patient on the date of this encounter to include pre-visit review of her medical history,  most recent imaging studies, Face-to-face time with the patient , and post visit ordering of testing and therapeutics.    There are no discontinued medications.  Follow-up: No follow-ups on file.   Crecencio Mc, MD

## 2022-02-03 NOTE — Assessment & Plan Note (Addendum)
Reviewed today verbally.  Asked to bring a copy to office.  DNR discussed;  She declines at this time

## 2022-02-03 NOTE — Patient Instructions (Addendum)
For your leg pain :  You can increase your dose and use of tylenol to  2000 mg of avery day safely  In divided doses (1000 mg every 12 hours.)    Your blood pressure is PERFECT  .  Continue amlodipine 2.5 mg     Your  DEXA scan  been ordered to check your bone density  Please call to schedule your own  appointment at [facility]  , and their phone number is [phone]    Please bring [place] a copy of your Living Will so we can scan it into your chart

## 2022-02-03 NOTE — Assessment & Plan Note (Signed)
She has resumed annual mammograms after perceiving a mass last year on self exam..  Last year's exam and  mammogram was normal. Repeat annual screening ordered.

## 2022-02-03 NOTE — Assessment & Plan Note (Signed)
Well controlled on low dose amlodipine . Renal function stable, no changes today.

## 2022-02-04 LAB — LIPID PANEL W/REFLEX DIRECT LDL
Cholesterol: 161 mg/dL (ref <200)
HDL: 57 mg/dL (ref >=50)
LDL Cholesterol (Calc): 89 mg/dL (calc)
Non-HDL Cholesterol (Calc): 104 mg/dL (calc) (ref <130)
Total CHOL/HDL Ratio: 2.8 (calc) (ref <5.0)
Triglycerides: 60 mg/dL (ref <150)

## 2022-02-06 ENCOUNTER — Telehealth: Payer: Self-pay

## 2022-02-06 ENCOUNTER — Encounter: Payer: Self-pay | Admitting: Internal Medicine

## 2022-02-06 NOTE — Telephone Encounter (Signed)
LMTCB for Lab Results

## 2022-02-06 NOTE — Addendum Note (Signed)
Addended by: Crecencio Mc on: 02/06/2022 02:34 PM   Modules accepted: Orders

## 2022-02-06 NOTE — Assessment & Plan Note (Signed)
Repeatedly noted.  Advised to stop calcium and return for PTH in 2 weeks

## 2022-02-06 NOTE — Telephone Encounter (Signed)
-----   Message from Crecencio Mc, MD sent at 02/06/2022  2:33 PM EDT ----- All of your labs are fine except that Your calcium level is still elevated .  Please stop your calcium supplements and  return for a recheck in 2 weeks , to see if it is persistent,  which may be  due to an overactive parathyroid gland.  You will need to return for the blood tests but you do not need to fast.  Please call the office to make a lab appointment.   This is not an  Emergency; you can wait until  Next week!

## 2022-02-06 NOTE — Telephone Encounter (Signed)
LMTCB in regards to lab results. Please give results when pt call back.

## 2022-02-07 NOTE — Telephone Encounter (Signed)
Spoke to Patient to give her Lab Results and Dr. Lupita Dawn recommendations to stop the Calcium and recheck labs in 2 weeks. Patient voiced understanding.

## 2022-02-12 ENCOUNTER — Other Ambulatory Visit: Payer: PPO

## 2022-02-12 DIAGNOSIS — S80862D Insect bite (nonvenomous), left lower leg, subsequent encounter: Secondary | ICD-10-CM | POA: Diagnosis not present

## 2022-02-12 DIAGNOSIS — W57XXXD Bitten or stung by nonvenomous insect and other nonvenomous arthropods, subsequent encounter: Secondary | ICD-10-CM | POA: Diagnosis not present

## 2022-02-20 ENCOUNTER — Other Ambulatory Visit (INDEPENDENT_AMBULATORY_CARE_PROVIDER_SITE_OTHER): Payer: PPO

## 2022-02-27 ENCOUNTER — Telehealth: Payer: Self-pay

## 2022-02-27 LAB — PTH, INTACT AND CALCIUM
Calcium: 10.8 mg/dL — ABNORMAL HIGH (ref 8.6–10.4)
PTH: 77 pg/mL (ref 16–77)

## 2022-02-27 LAB — PTH-RELATED PEPTIDE: PTH-Related Protein (PTH-RP): 11 pg/mL (ref 11–20)

## 2022-02-27 NOTE — Telephone Encounter (Signed)
LMTCB for lab results.  

## 2022-03-04 NOTE — Addendum Note (Signed)
Addended by: Crecencio Mc on: 03/04/2022 01:10 PM   Modules accepted: Orders

## 2022-03-25 ENCOUNTER — Telehealth: Payer: Self-pay | Admitting: Internal Medicine

## 2022-03-25 NOTE — Telephone Encounter (Signed)
Sharyn Lull from [facility] called stating they need recent office notes for pt in order to schedule an appointment

## 2022-03-25 NOTE — Telephone Encounter (Signed)
Faxed

## 2022-04-03 DIAGNOSIS — M1711 Unilateral primary osteoarthritis, right knee: Secondary | ICD-10-CM | POA: Diagnosis not present

## 2022-04-09 DIAGNOSIS — Z961 Presence of intraocular lens: Secondary | ICD-10-CM | POA: Diagnosis not present

## 2022-04-16 ENCOUNTER — Telehealth: Payer: Self-pay | Admitting: Internal Medicine

## 2022-04-16 MED ORDER — AMLODIPINE BESYLATE 2.5 MG PO TABS: 2.5000 mg | ORAL_TABLET | Freq: Every day | ORAL | 1 refills | Status: DC

## 2022-04-16 NOTE — Telephone Encounter (Signed)
Medication has been refilled.

## 2022-04-16 NOTE — Telephone Encounter (Signed)
Pt need a refill on amlodipine sent to [facility]

## 2022-04-22 DIAGNOSIS — E049 Nontoxic goiter, unspecified: Secondary | ICD-10-CM | POA: Diagnosis not present

## 2022-04-22 DIAGNOSIS — K219 Gastro-esophageal reflux disease without esophagitis: Secondary | ICD-10-CM | POA: Diagnosis not present

## 2022-04-22 DIAGNOSIS — I10 Essential (primary) hypertension: Secondary | ICD-10-CM | POA: Diagnosis not present

## 2022-04-24 ENCOUNTER — Ambulatory Visit
Admission: RE | Admit: 2022-04-24 | Discharge: 2022-04-24 | Disposition: A | Discharge status: Home / Self Care | Payer: PPO | Source: Ambulatory Visit | Attending: Internal Medicine | Admitting: Internal Medicine

## 2022-04-24 DIAGNOSIS — Z780 Asymptomatic menopausal state: Secondary | ICD-10-CM | POA: Diagnosis not present

## 2022-04-24 DIAGNOSIS — M8589 Other specified disorders of bone density and structure, multiple sites: Secondary | ICD-10-CM | POA: Diagnosis not present

## 2022-04-24 DIAGNOSIS — E210 Primary hyperparathyroidism: Secondary | ICD-10-CM | POA: Diagnosis not present

## 2022-04-24 DIAGNOSIS — M8580 Other specified disorders of bone density and structure, unspecified site: Secondary | ICD-10-CM | POA: Insufficient documentation

## 2022-04-25 DIAGNOSIS — I10 Essential (primary) hypertension: Secondary | ICD-10-CM | POA: Diagnosis not present

## 2022-04-26 DIAGNOSIS — E210 Primary hyperparathyroidism: Secondary | ICD-10-CM | POA: Insufficient documentation

## 2022-04-26 NOTE — Assessment & Plan Note (Signed)
With worsening osteopenia ( T score -2.4 forearm,  DEXA Oct 2023).  Endocrinology referral in progress  Lab Results  Component Value Date   PTH 77 02/20/2022   CALCIUM 10.8 (H) 02/20/2022

## 2022-04-26 NOTE — Assessment & Plan Note (Signed)
Worsening by 2023 DEXA ,  With hypercalcemia and hyperparathyroidism suggested by July 2023 labs.  Referral to endocrinology was made [facility] July.  No records received.

## 2022-04-26 NOTE — Assessment & Plan Note (Signed)
Secondary to hyperparathyroidism.

## 2022-04-29 ENCOUNTER — Telehealth: Payer: Self-pay

## 2022-04-29 NOTE — Telephone Encounter (Signed)
-----   Message from Crecencio Mc, MD sent at 04/26/2022 10:20 AM EDT ----- Her bone density is getting worse. Byt recent DEXA scan   She was referred to Dr Carmela Rima (endocrinology) in July for hypercalcemia due to overactive parathyroid gland.  Has she been seen by him ?

## 2022-04-29 NOTE — Telephone Encounter (Signed)
LMTCB regarding her Dexa Scan and to see of she has seen Dr. Moryati-Endocrinology yet?

## 2022-05-06 ENCOUNTER — Other Ambulatory Visit: Payer: Self-pay | Admitting: Internal Medicine

## 2022-05-07 DIAGNOSIS — K219 Gastro-esophageal reflux disease without esophagitis: Secondary | ICD-10-CM | POA: Diagnosis not present

## 2022-05-07 DIAGNOSIS — E049 Nontoxic goiter, unspecified: Secondary | ICD-10-CM | POA: Diagnosis not present

## 2022-05-07 DIAGNOSIS — I10 Essential (primary) hypertension: Secondary | ICD-10-CM | POA: Diagnosis not present

## 2022-05-22 DIAGNOSIS — I10 Essential (primary) hypertension: Secondary | ICD-10-CM | POA: Diagnosis not present

## 2022-05-22 DIAGNOSIS — K219 Gastro-esophageal reflux disease without esophagitis: Secondary | ICD-10-CM | POA: Diagnosis not present

## 2022-05-22 DIAGNOSIS — E049 Nontoxic goiter, unspecified: Secondary | ICD-10-CM | POA: Diagnosis not present

## 2022-06-19 DIAGNOSIS — E049 Nontoxic goiter, unspecified: Secondary | ICD-10-CM | POA: Diagnosis not present

## 2022-06-19 DIAGNOSIS — I10 Essential (primary) hypertension: Secondary | ICD-10-CM | POA: Diagnosis not present

## 2022-06-19 DIAGNOSIS — K219 Gastro-esophageal reflux disease without esophagitis: Secondary | ICD-10-CM | POA: Diagnosis not present

## 2022-06-26 DIAGNOSIS — I10 Essential (primary) hypertension: Secondary | ICD-10-CM | POA: Diagnosis not present

## 2022-06-26 DIAGNOSIS — E049 Nontoxic goiter, unspecified: Secondary | ICD-10-CM | POA: Diagnosis not present

## 2022-06-26 DIAGNOSIS — K219 Gastro-esophageal reflux disease without esophagitis: Secondary | ICD-10-CM | POA: Diagnosis not present

## 2022-06-27 ENCOUNTER — Telehealth: Payer: Self-pay | Admitting: Internal Medicine

## 2022-06-27 NOTE — Telephone Encounter (Signed)
Patient called and wanted to ask Dr Derrel Nip about her bp, it has been elevated. Another doctor put her on  Phospho-Trin, 250 mg 1x daily. She would like to know if this medication would effect her bp.

## 2022-06-30 ENCOUNTER — Other Ambulatory Visit: Payer: Self-pay | Admitting: Internal Medicine

## 2022-07-02 NOTE — Telephone Encounter (Signed)
Pt is aware.  

## 2022-07-15 ENCOUNTER — Other Ambulatory Visit: Payer: Self-pay | Admitting: Internal Medicine

## 2022-08-08 ENCOUNTER — Telehealth: Payer: Self-pay | Admitting: Internal Medicine

## 2022-08-08 DIAGNOSIS — R2240 Localized swelling, mass and lump, unspecified lower limb: Secondary | ICD-10-CM | POA: Diagnosis not present

## 2022-08-08 NOTE — Telephone Encounter (Signed)
Pt called in because she's want to know if Dr. Derrel Nip can do a ultrasound on her leg. She went to [facility] and they advice her to see her provider concerning this issue. She would like Janett Billow or Dr. Derrel Nip to give her a call.

## 2022-08-09 ENCOUNTER — Other Ambulatory Visit: Payer: Self-pay | Admitting: Student

## 2022-08-09 ENCOUNTER — Ambulatory Visit
Admission: RE | Admit: 2022-08-09 | Discharge: 2022-08-09 | Disposition: A | Discharge status: Home / Self Care | Payer: PPO | Source: Ambulatory Visit | Attending: Student | Admitting: Student

## 2022-08-09 DIAGNOSIS — R2242 Localized swelling, mass and lump, left lower limb: Secondary | ICD-10-CM | POA: Insufficient documentation

## 2022-08-09 DIAGNOSIS — M7989 Other specified soft tissue disorders: Secondary | ICD-10-CM | POA: Diagnosis not present

## 2022-08-11 NOTE — Telephone Encounter (Signed)
Spoke with pt and she stated that [facility] ordered that [place] and she had it done on Saturday. Pt was advised to give them a call to request the results.

## 2022-08-18 DIAGNOSIS — M79662 Pain in left lower leg: Secondary | ICD-10-CM | POA: Diagnosis not present

## 2022-08-25 ENCOUNTER — Telehealth: Payer: Self-pay | Admitting: Internal Medicine

## 2022-08-25 NOTE — Telephone Encounter (Signed)
Pt called stating her groin area is swelling. Sent to access nurse

## 2022-08-25 NOTE — Telephone Encounter (Signed)
Pt is scheduled for tomorrow with Dr. Derrel Nip.

## 2022-08-26 ENCOUNTER — Ambulatory Visit: Payer: PPO | Admitting: Internal Medicine

## 2022-08-27 DIAGNOSIS — M7989 Other specified soft tissue disorders: Secondary | ICD-10-CM | POA: Diagnosis not present

## 2022-08-27 DIAGNOSIS — M79662 Pain in left lower leg: Secondary | ICD-10-CM | POA: Diagnosis not present

## 2022-08-27 DIAGNOSIS — I8392 Asymptomatic varicose veins of left lower extremity: Secondary | ICD-10-CM | POA: Diagnosis not present

## 2022-08-28 ENCOUNTER — Other Ambulatory Visit: Payer: Self-pay | Admitting: Orthopedic Surgery

## 2022-08-28 DIAGNOSIS — M79662 Pain in left lower leg: Secondary | ICD-10-CM

## 2022-08-28 DIAGNOSIS — M7989 Other specified soft tissue disorders: Secondary | ICD-10-CM

## 2022-08-28 DIAGNOSIS — I8392 Asymptomatic varicose veins of left lower extremity: Secondary | ICD-10-CM

## 2022-09-03 ENCOUNTER — Encounter: Payer: Self-pay | Admitting: Internal Medicine

## 2022-09-03 ENCOUNTER — Ambulatory Visit (INDEPENDENT_AMBULATORY_CARE_PROVIDER_SITE_OTHER): Payer: PPO | Admitting: Internal Medicine

## 2022-09-03 VITALS — BP 120/78 | HR 74 | Temp 97.8°F | Resp 15 | Ht 65.0 in | Wt 137.8 lb

## 2022-09-03 DIAGNOSIS — M79605 Pain in left leg: Secondary | ICD-10-CM | POA: Insufficient documentation

## 2022-09-03 DIAGNOSIS — I10 Essential (primary) hypertension: Secondary | ICD-10-CM

## 2022-09-03 DIAGNOSIS — K4020 Bilateral inguinal hernia, without obstruction or gangrene, not specified as recurrent: Secondary | ICD-10-CM | POA: Insufficient documentation

## 2022-09-03 DIAGNOSIS — R1909 Other intra-abdominal and pelvic swelling, mass and lump: Secondary | ICD-10-CM | POA: Diagnosis not present

## 2022-09-03 LAB — CBC WITH DIFFERENTIAL/PLATELET
Basophils Absolute: 0 10*3/uL (ref 0.0–0.1)
Basophils Relative: 0.7 % (ref 0.0–3.0)
Eosinophils Absolute: 0.1 10*3/uL (ref 0.0–0.7)
Eosinophils Relative: 2.2 % (ref 0.0–5.0)
HCT: 38.5 % (ref 36.0–46.0)
Hemoglobin: 13.1 g/dL (ref 12.0–15.0)
Lymphocytes Relative: 21.6 % (ref 12.0–46.0)
Lymphs Abs: 1.3 10*3/uL (ref 0.7–4.0)
MCHC: 33.9 g/dL (ref 30.0–36.0)
MCV: 95.3 fl (ref 78.0–100.0)
Monocytes Absolute: 0.5 10*3/uL (ref 0.1–1.0)
Monocytes Relative: 8.5 % (ref 3.0–12.0)
Neutro Abs: 4.1 10*3/uL (ref 1.4–7.7)
Neutrophils Relative %: 67 % (ref 43.0–77.0)
Platelets: 302 10*3/uL (ref 150.0–400.0)
RBC: 4.04 Mil/uL (ref 3.87–5.11)
RDW: 13 % (ref 11.5–15.5)
WBC: 6.2 10*3/uL (ref 4.0–10.5)

## 2022-09-03 LAB — COMPREHENSIVE METABOLIC PANEL
ALT: 12 U/L (ref 0–35)
AST: 17 U/L (ref 0–37)
Albumin: 4.5 g/dL (ref 3.5–5.2)
Alkaline Phosphatase: 65 U/L (ref 39–117)
BUN: 12 mg/dL (ref 6–23)
CO2: 31 mEq/L (ref 19–32)
Calcium: 10.9 mg/dL — ABNORMAL HIGH (ref 8.4–10.5)
Chloride: 103 mEq/L (ref 96–112)
Creatinine, Ser: 0.66 mg/dL (ref 0.40–1.20)
GFR: 77.59 mL/min (ref >60.00)
Glucose, Bld: 76 mg/dL (ref 70–99)
Potassium: 3.9 mEq/L (ref 3.5–5.1)
Sodium: 140 mEq/L (ref 135–145)
Total Bilirubin: 0.6 mg/dL (ref 0.2–1.2)
Total Protein: 6.9 g/dL (ref 6.0–8.3)

## 2022-09-03 NOTE — Assessment & Plan Note (Signed)
CT pelvis and abdomen ordered to evaluate for an underlying cause of bilateral hernias

## 2022-09-03 NOTE — Progress Notes (Signed)
Subjective:  Patient ID: Bridget [name], female    DOB: Mar 01, 1931  Age: 89 y.o. MRN: [id number]  CC: The primary encounter diagnosis was Groin mass in female. Diagnoses of Essential hypertension, Non-recurrent bilateral inguinal hernia without obstruction or gangrene, and Lower extremity pain, anterior, left were also pertinent to this visit.   HPI Shalawn [place] MOHS presents for evaluation of painless swelling in her inguinal area Chief Complaint  Patient presents with   Acute Visit    Swelling in groin area   She is an 89 yr old female with a recent development of painful swelling of the LLE , with MRI of Lower extremity  ordered by [facility].  Prior workup including x rays and doppler  both negative.    For the last several weeks she has noticed bilateral swelling in area . The areas have progressively enlarged without pain  fevers or redness .     Outpatient Medications Prior to Visit  Medication Sig Dispense Refill   acetaminophen (TYLENOL) 500 MG tablet Take 1,000 mg by mouth daily as needed.     amLODipine (NORVASC) 2.5 MG tablet TAKE 1 TABLET BY MOUTH ONCE DAILY 30 tablet 1   Multiple Vitamins-Minerals (CENTRUM SILVER PO) Take 1 tablet by mouth daily.       pantoprazole (PROTONIX) 40 MG tablet TAKE 1 TABLET BY MOUTH ONCE DAILY 90 tablet 1   vitamin C (ASCORBIC ACID) 500 MG tablet Take 500 mg by mouth daily.     Calcium Carbonate-Vitamin D 600-200 MG-UNIT TABS Take 1 tablet by mouth daily.       No facility-administered medications prior to visit.    Review of Systems;  Patient denies headache, fevers, malaise, unintentional weight loss, skin rash, eye pain, sinus congestion and sinus pain, sore throat, dysphagia,  hemoptysis , cough, dyspnea, wheezing, chest pain, palpitations, orthopnea, edema, abdominal pain, nausea, melena, diarrhea, constipation, flank pain, dysuria, hematuria, urinary  Frequency, nocturia, numbness, tingling, seizures,  Focal weakness,  Loss of consciousness,  Tremor, insomnia, depression, anxiety, and suicidal ideation.      Objective:  BP 120/78   Pulse 74   Temp 97.8 F (36.6 C) (Oral)   Resp 15   Ht 5' 5"$  (1.651 m)   Wt 137 lb 12.8 oz (62.5 kg)   SpO2 97%   BMI 22.93 kg/m   BP Readings from Last 3 Encounters:  09/03/22 120/78  02/03/22 120/80  02/06/21 116/76    Wt Readings from Last 3 Encounters:  09/03/22 137 lb 12.8 oz (62.5 kg)  02/03/22 137 lb 9.6 oz (62.4 kg)  11/26/21 137 lb (62.1 kg)    Physical Exam Vitals reviewed.  Constitutional:      General: She is not in acute distress.    Appearance: Normal appearance. She is normal weight. She is not ill-appearing, toxic-appearing or diaphoretic.  HENT:     Head: Normocephalic.  Eyes:     General: No scleral icterus.       Right eye: No discharge.        Left eye: No discharge.     Conjunctiva/sclera: Conjunctivae normal.  Cardiovascular:     Rate and Rhythm: Normal rate and regular rhythm.     Heart sounds: Normal heart sounds.  Pulmonary:     Effort: Pulmonary effort is normal. No respiratory distress.     Breath sounds: Normal breath sounds.  Genitourinary:   Musculoskeletal:        General: Normal range of motion.  Skin:  General: Skin is warm and dry.  Neurological:     General: No focal deficit present.     Mental Status: She is alert and oriented to person, place, and time. Mental status is at baseline.  Psychiatric:        Mood and Affect: Mood normal.        Behavior: Behavior normal.        Thought Content: Thought content normal.        Judgment: Judgment normal.     Lab Results  Component Value Date   HGBA1C 5.5 08/26/2018    Lab Results  Component Value Date   CREATININE 0.66 09/03/2022   CREATININE 0.76 02/03/2022   CREATININE 0.86 01/18/2021    Lab Results  Component Value Date   WBC 6.2 09/03/2022   HGB 13.1 09/03/2022   HCT 38.5 09/03/2022   PLT 302.0 09/03/2022   GLUCOSE 76 09/03/2022   CHOL  161 02/03/2022   TRIG 60 02/03/2022   HDL 57 02/03/2022   LDLCALC 89 02/03/2022   ALT 12 09/03/2022   AST 17 09/03/2022   NA 140 09/03/2022   K 3.9 09/03/2022   CL 103 09/03/2022   CREATININE 0.66 09/03/2022   BUN 12 09/03/2022   CO2 31 09/03/2022   TSH 0.62 02/03/2022   HGBA1C 5.5 08/26/2018   MICROALBUR 0.7 01/18/2021    US Venous Img Lower Unilateral Left (DVT)  Result Date: 08/09/2022 CLINICAL DATA:  swelling of left lower leg EXAM: LEFT LOWER EXTREMITY VENOUS DOPPLER ULTRASOUND TECHNIQUE: Gray-scale sonography with compression, as well as color and duplex ultrasound, were performed to evaluate the deep venous system(s) from the level of the common femoral vein through the popliteal and proximal calf veins. COMPARISON:  LEFT lower extremity venous duplex, 07/17/2020. FINDINGS: VENOUS Normal compressibility of the common femoral, superficial femoral, and popliteal veins, as well as the visualized calf veins. Visualized portions of profunda femoral vein and great saphenous vein unremarkable. No filling defects to suggest DVT on grayscale or color Doppler imaging. Doppler waveforms show normal direction of venous flow, normal respiratory plasticity and response to augmentation. Limited views of the contralateral common femoral vein are unremarkable. OTHER No evidence of superficial thrombophlebitis. At the posterior LEFT knee within the fossa is a well-circumscribed anechoic and avascular fluid collection measuring approximately 2.0 x 2.9 x 1.0 cm, consistent with a popliteal fossa/Baker cyst. Limitations: none IMPRESSION: 1. No evidence of femoropopliteal DVT or superficial thrombophlebitis within the LEFT lower extremity. 2. 3 cm LEFT popliteal fossa/Baker cyst. Michaelle Birks, MD [facility] Radiology Electronically Signed   By: Michaelle Birks M.D.   On: 08/09/2022 12:01    Assessment & Plan:  .Groin mass in female -     CT ABDOMEN PELVIS W  CONTRAST; Future -     CBC with Differential/Platelet  Essential hypertension -     Comprehensive metabolic panel  Non-recurrent bilateral inguinal hernia without obstruction or gangrene Assessment & Plan: CT pelvis and abdomen ordered to evaluate for an underlying cause of bilateral hernias    Lower extremity pain, anterior, left Assessment & Plan: With redness and painful swelling . DVT has been ruled out.  MRI LE ordered       I provided 30 minutes of face-to-face time during this encounter reviewing patient's last visit with me, patient's  most recent visit with cardiology,  nephrology,  and neurology,  recent surgical and non surgical procedures, previous  labs and imaging studies, counseling on  currently addressed issues,  and post visit ordering to diagnostics and therapeutics .   Follow-up: No follow-ups on file.   Crecencio Mc, MD

## 2022-09-03 NOTE — Patient Instructions (Addendum)
I have ordered a CT of your abdomen and pelvis to confirm that the swelling in your groin is due to hernias on both sides.  This is a better test for this area than an MRI  and can be obtained more easily

## 2022-09-03 NOTE — Assessment & Plan Note (Signed)
With redness and painful swelling . DVT has been ruled out.  MRI LE ordered

## 2022-09-15 ENCOUNTER — Other Ambulatory Visit: Payer: PPO

## 2022-09-16 ENCOUNTER — Ambulatory Visit
Admission: RE | Admit: 2022-09-16 | Discharge: 2022-09-16 | Disposition: A | Discharge status: Home / Self Care | Payer: PPO | Source: Ambulatory Visit | Attending: Internal Medicine | Admitting: Internal Medicine

## 2022-09-16 DIAGNOSIS — R1909 Other intra-abdominal and pelvic swelling, mass and lump: Secondary | ICD-10-CM | POA: Insufficient documentation

## 2022-09-16 DIAGNOSIS — N281 Cyst of kidney, acquired: Secondary | ICD-10-CM | POA: Diagnosis not present

## 2022-09-16 DIAGNOSIS — K760 Fatty (change of) liver, not elsewhere classified: Secondary | ICD-10-CM | POA: Diagnosis not present

## 2022-09-16 MED ORDER — IOHEXOL 300 MG/ML  SOLN
100.0000 mL | Freq: Once | INTRAMUSCULAR | Status: AC | PRN
  Administered 2022-09-16: 100 mL via INTRAVENOUS

## 2022-09-18 DIAGNOSIS — E049 Nontoxic goiter, unspecified: Secondary | ICD-10-CM | POA: Diagnosis not present

## 2022-09-18 DIAGNOSIS — K219 Gastro-esophageal reflux disease without esophagitis: Secondary | ICD-10-CM | POA: Diagnosis not present

## 2022-09-18 DIAGNOSIS — I10 Essential (primary) hypertension: Secondary | ICD-10-CM | POA: Diagnosis not present

## 2022-09-23 ENCOUNTER — Telehealth: Payer: Self-pay | Admitting: Internal Medicine

## 2022-09-23 ENCOUNTER — Ambulatory Visit: Payer: PPO | Admitting: Internal Medicine

## 2022-09-23 ENCOUNTER — Ambulatory Visit
Admission: RE | Admit: 2022-09-23 | Discharge: 2022-09-23 | Disposition: A | Discharge status: Home / Self Care | Payer: PPO | Source: Ambulatory Visit | Attending: Orthopedic Surgery | Admitting: Orthopedic Surgery

## 2022-09-23 DIAGNOSIS — R600 Localized edema: Secondary | ICD-10-CM | POA: Diagnosis not present

## 2022-09-23 DIAGNOSIS — I8392 Asymptomatic varicose veins of left lower extremity: Secondary | ICD-10-CM

## 2022-09-23 DIAGNOSIS — M7989 Other specified soft tissue disorders: Secondary | ICD-10-CM

## 2022-09-23 DIAGNOSIS — M79662 Pain in left lower leg: Secondary | ICD-10-CM

## 2022-09-23 MED ORDER — GADOPICLENOL 0.5 MMOL/ML IV SOLN
7.0000 mL | Freq: Once | INTRAVENOUS | Status: AC | PRN
  Administered 2022-09-23: 7 mL via INTRAVENOUS

## 2022-09-23 NOTE — Telephone Encounter (Signed)
Patient came in 15 minutes late to her 11:30 appt today 09/23/22 because her MRI had ran over. I was told to reschedule but next available isnt until April. Can she be worked in sooner?

## 2022-09-23 NOTE — Telephone Encounter (Signed)
Spoke with pt and rescheduled her appt for this Thursday.

## 2022-09-24 DIAGNOSIS — I10 Essential (primary) hypertension: Secondary | ICD-10-CM | POA: Diagnosis not present

## 2022-09-24 DIAGNOSIS — K219 Gastro-esophageal reflux disease without esophagitis: Secondary | ICD-10-CM | POA: Diagnosis not present

## 2022-09-24 DIAGNOSIS — E049 Nontoxic goiter, unspecified: Secondary | ICD-10-CM | POA: Diagnosis not present

## 2022-09-25 ENCOUNTER — Encounter: Payer: Self-pay | Admitting: Internal Medicine

## 2022-09-25 ENCOUNTER — Ambulatory Visit (INDEPENDENT_AMBULATORY_CARE_PROVIDER_SITE_OTHER): Payer: PPO | Admitting: Internal Medicine

## 2022-09-25 VITALS — BP 128/74 | HR 94 | Temp 98.5°F | Ht 65.0 in | Wt 138.0 lb

## 2022-09-25 DIAGNOSIS — I700 Atherosclerosis of aorta: Secondary | ICD-10-CM

## 2022-09-25 DIAGNOSIS — I708 Atherosclerosis of other arteries: Secondary | ICD-10-CM

## 2022-09-25 DIAGNOSIS — M79605 Pain in left leg: Secondary | ICD-10-CM

## 2022-09-25 DIAGNOSIS — E210 Primary hyperparathyroidism: Secondary | ICD-10-CM | POA: Diagnosis not present

## 2022-09-25 DIAGNOSIS — K4020 Bilateral inguinal hernia, without obstruction or gangrene, not specified as recurrent: Secondary | ICD-10-CM

## 2022-09-25 MED ORDER — ROSUVASTATIN CALCIUM 10 MG PO TABS: 10.0000 mg | ORAL_TABLET | ORAL | 2 refills | Status: DC

## 2022-09-25 NOTE — Assessment & Plan Note (Addendum)
She ruled out for DVT by [facility],  was seen by Poggi on Feb 7  .  An MRI was ordered along with vascular surgery consult for  evaluation of varicose veins .   The MRI did not show an abscess,  but did note soft tissue swelling suggestive of cellulitis vs fat necrosis vs overlying skin cancer.  The patient recalls having had a tick bite that left an area of hyperpigmentation .  Marland Kitchen

## 2022-09-25 NOTE — Assessment & Plan Note (Signed)
With worsening osteopenia ( T score -2.4 forearm,  DEXA Oct 2023).  Endocrinology referral in progress  Lab Results  Component Value Date   PTH 77 02/20/2022   CALCIUM 10.9 (H) 09/03/2022

## 2022-09-25 NOTE — Assessment & Plan Note (Signed)
Fat filled, bilateral.  No indication for surgery.

## 2022-09-25 NOTE — Patient Instructions (Addendum)
  The" aortic and coronary artery calcifications "  that were noted on your recent CT scan  means that you have :"atherosclerosis"  and increases your risk for heart attack and stroke  I am recommending that you try taking rosuvastatin two times WEEKLY to protect you from placque rupture which can cause a stroke or heart attack  This medication is also recommended for "hepatic steatosis" (fatty liver)  Return for blood work in 3 weeks

## 2022-09-25 NOTE — Assessment & Plan Note (Signed)
Reviewed findings of prior CT scan today..  Patient is willing to  Initiate statin therpay starting with 20 mg crestor twice weekly and return for LFTs in 3 weeks

## 2022-09-25 NOTE — Progress Notes (Signed)
Subjective:  Patient ID: Bridget [name], female    DOB: Dec 18, 1930  Age: 89 y.o. MRN: [id number]  CC: The primary encounter diagnosis was Lower extremity pain, anterior, left. Diagnoses of Primary hyperparathyroidism ([place]), Hypercalcemia, Bilateral inguinal hernia without obstruction or gangrene, recurrence not specified, and Atherosclerosis of aortic bifurcation and common iliac arteries ([place]) were also pertinent to this visit.   HPI Bridget [name] presents for  Chief Complaint  Patient presents with   Discuss CT scan results   89 YR OLD female with history of hypertension, diverticulosis and MNG presents for follow up on recent CT abd pelvis done to evaluate bilateral inguinal hernias  Results of CT reviewed:  1)  fat filled inguinal hernias.  2) fatty liver , multiple hepatic cysts,   3) Coronary  calcifications, multi vessel  4) Aorto iliac calcification   5) MRI Left lower extremity was also reviewed (ordered by Orthopedics) per patient request . This was done on March 5 to investigate an area of tender swelling has been present since she had a tick bite last summer ,.  She notes bilateral LE tenderness due to  chronic  venous insufficiency  Outpatient Medications Prior to Visit  Medication Sig Dispense Refill   acetaminophen (TYLENOL) 500 MG tablet Take 1,000 mg by mouth daily as needed.     amLODipine (NORVASC) 2.5 MG tablet TAKE 1 TABLET BY MOUTH ONCE DAILY 30 tablet 1   Multiple Vitamins-Minerals (CENTRUM SILVER PO) Take 1 tablet by mouth daily.       pantoprazole (PROTONIX) 40 MG tablet TAKE 1 TABLET BY MOUTH ONCE DAILY 90 tablet 1   vitamin C (ASCORBIC ACID) 500 MG tablet Take 500 mg by mouth daily.     No facility-administered medications prior to visit.    Review of Systems;  Patient denies headache, fevers, malaise, unintentional weight loss, skin rash, eye pain, sinus congestion and sinus pain, sore throat, dysphagia,  hemoptysis , cough, dyspnea, wheezing,  chest pain, palpitations, orthopnea, edema, abdominal pain, nausea, melena, diarrhea, constipation, flank pain, dysuria, hematuria, urinary  Frequency, nocturia, numbness, tingling, seizures,  Focal weakness, Loss of consciousness,  Tremor, insomnia, depression, anxiety, and suicidal ideation.     Objective:  BP 128/74   Pulse 94   Temp 98.5 F (36.9 C) (Oral)   Ht '5\' 5"'$  (1.651 m)   Wt 138 lb (62.6 kg)   SpO2 97%   BMI 22.96 kg/m   BP Readings from Last 3 Encounters:  09/25/22 128/74  09/03/22 120/78  02/03/22 120/80    Wt Readings from Last 3 Encounters:  09/25/22 138 lb (62.6 kg)  09/03/22 137 lb 12.8 oz (62.5 kg)  02/03/22 137 lb 9.6 oz (62.4 kg)    Physical Exam Vitals reviewed.  Constitutional:      General: She is not in acute distress.    Appearance: Normal appearance. She is normal weight. She is not ill-appearing, toxic-appearing or diaphoretic.  HENT:     Head: Normocephalic.  Eyes:     General: No scleral icterus.       Right eye: No discharge.        Left eye: No discharge.     Conjunctiva/sclera: Conjunctivae normal.  Cardiovascular:     Rate and Rhythm: Normal rate and regular rhythm.     Heart sounds: Normal heart sounds.  Pulmonary:     Effort: Pulmonary effort is normal. No respiratory distress.     Breath sounds: Normal breath sounds.  Musculoskeletal:  General: Normal range of motion.  Skin:    General: Skin is warm and dry.  Neurological:     General: No focal deficit present.     Mental Status: She is alert and oriented to person, place, and time. Mental status is at baseline.  Psychiatric:        Mood and Affect: Mood normal.        Behavior: Behavior normal.        Thought Content: Thought content normal.        Judgment: Judgment normal.    Lab Results  Component Value Date   HGBA1C 5.5 08/26/2018    Lab Results  Component Value Date   CREATININE 0.66 09/03/2022   CREATININE 0.76 02/03/2022   CREATININE 0.86 01/18/2021     Lab Results  Component Value Date   WBC 6.2 09/03/2022   HGB 13.1 09/03/2022   HCT 38.5 09/03/2022   PLT 302.0 09/03/2022   GLUCOSE 76 09/03/2022   CHOL 161 02/03/2022   TRIG 60 02/03/2022   HDL 57 02/03/2022   LDLCALC 89 02/03/2022   ALT 12 09/03/2022   AST 17 09/03/2022   NA 140 09/03/2022   K 3.9 09/03/2022   CL 103 09/03/2022   CREATININE 0.66 09/03/2022   BUN 12 09/03/2022   CO2 31 09/03/2022   TSH 0.62 02/03/2022   HGBA1C 5.5 08/26/2018   MICROALBUR 0.7 01/18/2021    MR TIBIA FIBULA LEFT W WO CONTRAST  Result Date: 09/24/2022 CLINICAL DATA:  No known injury. Left lower extremity soft tissue mass. Pain for few months. EXAM: MRI OF LOWER LEFT EXTREMITY WITHOUT AND WITH CONTRAST TECHNIQUE: Multiplanar, multisequence MR imaging of the left lower leg was performed both before and after administration of intravenous contrast. CONTRAST:  7 mL Vueway, 0 mL wasted COMPARISON:  None Available. FINDINGS: Bones/Joint/Cartilage No fracture or dislocation. Normal alignment. No joint effusion. No marrow signal abnormality. Partial-thickness cartilage loss of the medial and lateral femorotibial compartments bilaterally. Ligaments Collateral ligaments are intact. Muscles and Tendons Muscles are normal. Soft tissue No fluid collection or hematoma. Ill-defined area of soft tissue edema in the subcutaneous fat along the anteromedial distal tibial diaphysis with enhancement on postcontrast imaging and overlying skin thickening. Overall appearance is nonspecific and may reflect an area of fat necrosis versus cellulitis versus cutaneous neoplasm. Correlate with direct visualization. No solid definable mass. IMPRESSION: 1. Ill-defined area of soft tissue edema in the subcutaneous fat along the anteromedial distal tibial diaphysis with enhancement on postcontrast imaging and overlying skin thickening. Overall appearance is nonspecific and may reflect an area of fat necrosis versus cellulitis versus  cutaneous neoplasm. Correlate with direct visualization. 2. 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 Electronically Signed   By: Kathreen Devoid M.D.   On: 09/24/2022 06:01    Assessment & Plan:  .Lower extremity pain, anterior, left Assessment & Plan: She ruled out for DVT by [facility],  was seen by Poggi on Feb 7  .  An MRI was ordered along with vascular surgery consult for  evaluation of varicose veins .   The MRI did not show an abscess,  but did note soft tissue swelling suggestive of cellulitis vs fat necrosis vs overlying skin cancer.  The patient recalls having had a tick bite that left an area of hyperpigmentation .  Marland Kitchen    Primary hyperparathyroidism ([place]) Assessment & Plan: With worsening osteopenia ( T score -2.4 forearm,  DEXA Oct 2023).  Endocrinology referral  in progress  Lab Results  Component Value Date   PTH 77 02/20/2022   CALCIUM 10.9 (H) 09/03/2022      Hypercalcemia -     Comprehensive metabolic panel; Future  Bilateral inguinal hernia without obstruction or gangrene, recurrence not specified Assessment & Plan: Fat filled, bilateral.  No indication for surgery.    Atherosclerosis of aortic bifurcation and common iliac arteries [facility]) Assessment & Plan: Reviewed findings of prior CT scan today..  Patient is willing to  Initiate statin therpay starting with 20 mg crestor twice weekly and return for LFTs in 3 weeks    Other orders -     Rosuvastatin Calcium; Take 1 tablet (10 mg total) by mouth 2 (two) times a week.  Dispense: 25 tablet; Refill: 2     I provided 30 minutes of face-to-face time during this encounter reviewing patient's last visit with me, patient's  most recent visit with orthopedics,  recent  imaging studies, counseling on currently addressed issues,  and post visit ordering to diagnostics and therapeutics .   Follow-up: Return in about 6 months (around 03/28/2023).   Crecencio Mc, MD

## 2022-10-11 ENCOUNTER — Other Ambulatory Visit: Payer: Self-pay | Admitting: Internal Medicine

## 2022-10-21 ENCOUNTER — Other Ambulatory Visit (INDEPENDENT_AMBULATORY_CARE_PROVIDER_SITE_OTHER): Payer: Self-pay | Admitting: Vascular Surgery

## 2022-10-21 DIAGNOSIS — I83812 Varicose veins of left lower extremities with pain: Secondary | ICD-10-CM

## 2022-11-02 DIAGNOSIS — M79669 Pain in unspecified lower leg: Secondary | ICD-10-CM | POA: Insufficient documentation

## 2022-11-02 DIAGNOSIS — M7989 Other specified soft tissue disorders: Secondary | ICD-10-CM | POA: Insufficient documentation

## 2022-11-02 NOTE — Progress Notes (Unsigned)
MRN : [id number]  Bridget [name] is a 89 y.o. ([birth year]) female who presents with chief complaint of check circulation.  History of Present Illness:   Patient is seen for evaluation of leg pain and leg swelling. The patient first noticed the swelling remotely. The swelling is associated with pain and discoloration. The pain and swelling worsens with prolonged dependency and improves with elevation. The pain is unrelated to activity.  The patient notes that in the morning the legs are significantly improved but they steadily worsened throughout the course of the day. The patient also notes a steady worsening of the discoloration in the ankle and shin area.   The patient denies claudication symptoms.  The patient denies symptoms consistent with rest pain.  The patient denies and extensive history of DJD and LS spine disease.  The patient has no had any past angiography, interventions or vascular surgery.  Elevation makes the leg symptoms better, dependency makes them much worse. There is no history of ulcerations. The patient denies any recent changes in medications.  The patient has not been wearing graduated compression.  The patient denies a history of DVT or PE. There is no prior history of phlebitis. There is no history of primary lymphedema.  No history of malignancies. No history of trauma or groin or pelvic surgery. There is no history of radiation treatment to the groin or pelvis  The patient denies amaurosis fugax or recent TIA symptoms. There are no recent neurological changes noted. The patient denies recent episodes of angina or shortness of breath  No outpatient medications have been marked as taking for the 11/03/22 encounter (Appointment) with Gilda Crease, Latina Craver, MD.    Past Medical History:  Diagnosis Date   Diverticulosis of colon without diverticulitis    GERD (gastroesophageal reflux disease)     Past Surgical History:  Procedure Laterality  Date   ABDOMINAL HYSTERECTOMY     Not due to cancer, due to fibroids, BSO   OOPHORECTOMY     Bilateral   TOE SURGERY  06/2015   R Big toe Ingrown toenail removed      Social History Social History   Tobacco Use   Smoking status: Never   Smokeless tobacco: Never  Substance Use Topics   Alcohol use: No   Drug use: No    Family History Family History  Problem Relation Age of Onset   Heart failure Mother        Massive MI   Cancer Brother        prostate   Cancer Sister        colon   Cancer Sister        lung   Cancer Sister        bone   Cancer Brother        lung (smoker)   Breast cancer Neg Hx     Allergies  Allergen Reactions   Sulfa Antibiotics Itching     REVIEW OF SYSTEMS (Negative unless checked)  Constitutional: [] Weight loss  [] Fever  [] Chills Cardiac: [] Chest pain   [] Chest pressure   [] Palpitations   [] Shortness of breath when laying flat   [] Shortness of breath with exertion. Vascular:  [x] Pain in legs with walking   [] Pain in legs at rest  [] History of DVT   [] Phlebitis   [] Swelling in legs   [] Varicose veins   [] Non-healing ulcers Pulmonary:   [] Uses home oxygen   []   Productive cough   Hemoptysis   Wheeze  COPD   Asthma Neurologic:  Dizziness   Seizures   History of stroke   History of TIA  Aphasia   Vissual changes   Weakness or numbness in arm   Weakness or numbness in leg Musculoskeletal:   Joint swelling   Joint pain   Low back pain Hematologic:  Easy bruising  Easy bleeding   Hypercoagulable state   Anemic Gastrointestinal:  Diarrhea   Vomiting  Gastroesophageal reflux/heartburn   Difficulty swallowing. Genitourinary:  Chronic kidney disease   Difficult urination  Frequent urination   Blood in urine Skin:  Rashes   Ulcers  Psychological:  History of anxiety    History of major depression.  Physical Examination  There were no vitals filed for this visit. There is no height or  weight on file to calculate BMI. Gen: WD/WN, NAD Head: [place]/AT, No temporalis wasting.  Ear/Nose/Throat: Hearing grossly intact, nares w/o erythema or drainage Eyes: PER, EOMI, sclera nonicteric.  Neck: Supple, no masses.  No bruit or JVD.  Pulmonary:  Good air movement, no audible wheezing, no use of accessory muscles.  Cardiac: RRR, normal S1, S2, no Murmurs. Vascular:  scattered varicosities present bilaterally.  Mild venous stasis changes to the legs bilaterally.  3-4+ soft pitting edema, CEAP [id number] , no open wounds Vessel Right Left  Radial Palpable Palpable  PT Not Palpable Not Palpable  DP Not Palpable Not Palpable  Gastrointestinal: soft, non-distended. No guarding/no peritoneal signs.  Musculoskeletal: M/S 5/5 throughout.  No visible deformity.  Neurologic: CN 2-12 intact. Pain and light touch intact in extremities.  Symmetrical.  Speech is fluent. Motor exam as listed above. Psychiatric: Judgment intact, Mood & affect appropriate for pt's clinical situation. Dermatologic: No rashes or ulcers noted.  No changes consistent with cellulitis.   CBC Lab Results  Component Value Date   WBC 6.2 09/03/2022   HGB 13.1 09/03/2022   HCT 38.5 09/03/2022   MCV 95.3 09/03/2022   PLT 302.0 09/03/2022    BMET    Component Value Date/Time   NA 140 09/03/2022 0848   NA 144 02/08/2019 1057   K 3.9 09/03/2022 0848   CL 103 09/03/2022 0848   CO2 31 09/03/2022 0848   GLUCOSE 76 09/03/2022 0848   BUN 12 09/03/2022 0848   BUN 9 02/08/2019 1057   CREATININE 0.66 09/03/2022 0848   CREATININE 0.76 04/03/2011 1121   CALCIUM 10.9 (H) 09/03/2022 0848   GFRNONAA 64 02/08/2019 1057   GFRAA 74 02/08/2019 1057   CrCl cannot be calculated (Patient's most recent lab result is older than the maximum 21 days allowed.).  COAG No results found for: "INR", "PROTIME"  Radiology No results found.   Assessment/Plan There are no diagnoses linked to this encounter.   Levora Dredge,  MD  11/02/2022 1:10 PM

## 2022-11-03 ENCOUNTER — Ambulatory Visit (INDEPENDENT_AMBULATORY_CARE_PROVIDER_SITE_OTHER): Payer: PPO | Admitting: Vascular Surgery

## 2022-11-03 ENCOUNTER — Ambulatory Visit (INDEPENDENT_AMBULATORY_CARE_PROVIDER_SITE_OTHER): Payer: PPO

## 2022-11-03 ENCOUNTER — Encounter (INDEPENDENT_AMBULATORY_CARE_PROVIDER_SITE_OTHER): Payer: Self-pay | Admitting: Vascular Surgery

## 2022-11-03 VITALS — BP 138/78 | HR 68 | Resp 16 | Wt 140.0 lb

## 2022-11-03 DIAGNOSIS — I700 Atherosclerosis of aorta: Secondary | ICD-10-CM

## 2022-11-03 DIAGNOSIS — I708 Atherosclerosis of other arteries: Secondary | ICD-10-CM

## 2022-11-03 DIAGNOSIS — K219 Gastro-esophageal reflux disease without esophagitis: Secondary | ICD-10-CM | POA: Diagnosis not present

## 2022-11-03 DIAGNOSIS — M79669 Pain in unspecified lower leg: Secondary | ICD-10-CM | POA: Diagnosis not present

## 2022-11-03 DIAGNOSIS — I83812 Varicose veins of left lower extremities with pain: Secondary | ICD-10-CM

## 2022-11-03 DIAGNOSIS — M7989 Other specified soft tissue disorders: Secondary | ICD-10-CM | POA: Diagnosis not present

## 2022-11-03 DIAGNOSIS — I10 Essential (primary) hypertension: Secondary | ICD-10-CM

## 2022-11-05 ENCOUNTER — Other Ambulatory Visit: Payer: Self-pay | Admitting: Internal Medicine

## 2022-11-05 ENCOUNTER — Encounter (INDEPENDENT_AMBULATORY_CARE_PROVIDER_SITE_OTHER): Payer: Self-pay | Admitting: Vascular Surgery

## 2022-11-07 DIAGNOSIS — Z03818 Encounter for observation for suspected exposure to other biological agents ruled out: Secondary | ICD-10-CM | POA: Diagnosis not present

## 2022-11-07 DIAGNOSIS — J209 Acute bronchitis, unspecified: Secondary | ICD-10-CM | POA: Diagnosis not present

## 2022-11-07 DIAGNOSIS — B9689 Other specified bacterial agents as the cause of diseases classified elsewhere: Secondary | ICD-10-CM | POA: Diagnosis not present

## 2022-11-07 DIAGNOSIS — J019 Acute sinusitis, unspecified: Secondary | ICD-10-CM | POA: Diagnosis not present

## 2022-11-21 ENCOUNTER — Telehealth: Payer: Self-pay | Admitting: Internal Medicine

## 2022-11-21 NOTE — Telephone Encounter (Signed)
Copied from CRM [id number]. Topic: Medicare AWV >> Nov 21, 2022 10:10 AM Payton Doughty wrote: Reason for CRM: Called patient to schedule Medicare Annual Wellness Visit (AWV). Left message for patient to call back and schedule Medicare Annual Wellness Visit (AWV).  Last date of AWV: 11/26/21  Please schedule an appointment at any time with [facility].  If any questions, please contact me.  Thank you ,  Verlee Rossetti; Care Guide Ambulatory Clinical Support [place] [facility] Direct Dial: [phone]

## 2022-12-05 ENCOUNTER — Telehealth: Payer: Self-pay | Admitting: Internal Medicine

## 2022-12-05 NOTE — Telephone Encounter (Signed)
Copied from CRM [id number]. Topic: Medicare AWV >> Dec 05, 2022  9:34 AM Payton Doughty wrote: Reason for CRM: Called patient to schedule Medicare Annual Wellness Visit (AWV). Left message for patient to call back and schedule Medicare Annual Wellness Visit (AWV).  Last date of AWV: 11/26/2021  Please schedule an appointment at any time with Sydell Axon, CMA  .  If any questions, please contact me.  Thank you ,  Verlee Rossetti; Care Guide Ambulatory Clinical Support [place] [facility] Direct Dial: [phone]

## 2022-12-06 DIAGNOSIS — W57XXXA Bitten or stung by nonvenomous insect and other nonvenomous arthropods, initial encounter: Secondary | ICD-10-CM | POA: Diagnosis not present

## 2022-12-06 DIAGNOSIS — S30850A Superficial foreign body of lower back and pelvis, initial encounter: Secondary | ICD-10-CM | POA: Diagnosis not present

## 2022-12-06 DIAGNOSIS — S30860A Insect bite (nonvenomous) of lower back and pelvis, initial encounter: Secondary | ICD-10-CM | POA: Diagnosis not present

## 2022-12-06 DIAGNOSIS — Z1839 Other retained organic fragments: Secondary | ICD-10-CM | POA: Diagnosis not present

## 2022-12-06 DIAGNOSIS — L03312 Cellulitis of back [any part except buttock]: Secondary | ICD-10-CM | POA: Diagnosis not present

## 2022-12-10 ENCOUNTER — Other Ambulatory Visit: Payer: Self-pay | Admitting: Internal Medicine

## 2022-12-10 DIAGNOSIS — Z1231 Encounter for screening mammogram for malignant neoplasm of breast: Secondary | ICD-10-CM

## 2022-12-16 ENCOUNTER — Ambulatory Visit (INDEPENDENT_AMBULATORY_CARE_PROVIDER_SITE_OTHER): Payer: PPO | Admitting: Nurse Practitioner

## 2022-12-16 ENCOUNTER — Encounter: Payer: Self-pay | Admitting: Nurse Practitioner

## 2022-12-16 VITALS — BP 100/66 | HR 92 | Temp 98.3°F | Ht 65.0 in | Wt 139.2 lb

## 2022-12-16 DIAGNOSIS — M7918 Myalgia, other site: Secondary | ICD-10-CM | POA: Diagnosis not present

## 2022-12-16 NOTE — Progress Notes (Signed)
  Bethanie Dicker, NP-C Phone: [phone]  Bridget [name] is a 89 y.o. female who presents today for neck and shoulder pain.   Patient with bilateral shoulder and neck pain x 10 days. Pain has mostly resolved today. Pain 2/10. Patient describes pain as feeling tight and stiff around her trapezius muscles. Denies any known injury. Denies any heavy lifting. Denies fevers. Denies chest pain. Denies shortness of breath. She has been taking Tylenol. She has no difficulty with range of motion or strength.   Social History   Tobacco Use  Smoking Status Never  Smokeless Tobacco Never    Current Outpatient Medications on File Prior to Visit  Medication Sig Dispense Refill   acetaminophen (TYLENOL) 500 MG tablet Take 1,000 mg by mouth daily as needed.     amLODipine (NORVASC) 2.5 MG tablet TAKE 1 TABLET BY MOUTH ONCE DAILY 30 tablet 1   Multiple Vitamins-Minerals (CENTRUM SILVER PO) Take 1 tablet by mouth daily.       pantoprazole (PROTONIX) 40 MG tablet TAKE 1 TABLET BY MOUTH ONCE DAILY 90 tablet 1   rosuvastatin (CRESTOR) 10 MG tablet Take 1 tablet (10 mg total) by mouth 2 (two) times a week. 25 tablet 2   vitamin C (ASCORBIC ACID) 500 MG tablet Take 500 mg by mouth daily.     No current facility-administered medications on file prior to visit.    ROS see history of present illness  Objective  Physical Exam Vitals:   12/16/22 1453  BP: 100/66  Pulse: 92  Temp: 98.3 F (36.8 C)  SpO2: 96%    BP Readings from Last 3 Encounters:  12/16/22 100/66  11/03/22 138/78  09/25/22 128/74   Wt Readings from Last 3 Encounters:  12/16/22 139 lb 3.2 oz (63.1 kg)  11/03/22 140 lb (63.5 kg)  09/25/22 138 lb (62.6 kg)    Physical Exam Constitutional:      General: She is not in acute distress.    Appearance: Normal appearance.  HENT:     Head: Normocephalic.  Cardiovascular:     Rate and Rhythm: Normal rate and regular rhythm.     Heart sounds: Normal heart sounds.  Pulmonary:      Effort: Pulmonary effort is normal.     Breath sounds: Normal breath sounds.  Musculoskeletal:     Right shoulder: Normal. No swelling or tenderness. Normal range of motion. Normal strength.     Left shoulder: Normal. No swelling or tenderness. Normal range of motion. Normal strength.     Cervical back: Normal and normal range of motion. No swelling, torticollis or tenderness. No pain with movement. Normal range of motion.  Lymphadenopathy:     Cervical: No cervical adenopathy.  Skin:    General: Skin is warm and dry.  Neurological:     General: No focal deficit present.     Mental Status: She is alert.  Psychiatric:        Mood and Affect: Mood normal.        Behavior: Behavior normal.    Assessment/Plan: Please see individual problem list.  Myalgia, shoulder region Assessment & Plan: Essentially resolved. Exam- WNL. She can continue Tylenol as needed for pain. Advised gentle massage, alternating ice and heat or using topical creams such as IcyHot or Aspercreme. Return precautions given to patient.     Return if symptoms worsen or fail to improve.   Bethanie Dicker, NP-C  Primary Care - [facility]

## 2022-12-17 DIAGNOSIS — M7918 Myalgia, other site: Secondary | ICD-10-CM | POA: Insufficient documentation

## 2022-12-17 NOTE — Assessment & Plan Note (Signed)
Essentially resolved. Exam- WNL. She can continue Tylenol as needed for pain. Advised gentle massage, alternating ice and heat or using topical creams such as IcyHot or Aspercreme. Return precautions given to patient.

## 2022-12-30 ENCOUNTER — Telehealth: Payer: Self-pay | Admitting: Internal Medicine

## 2022-12-30 NOTE — Telephone Encounter (Signed)
Pt called stating the provider referred her to Bridget [name] but he is closing and she wants to make sure her records are transferred to Dr. Darrick Huntsman

## 2022-12-31 ENCOUNTER — Encounter: Payer: Self-pay | Admitting: *Deleted

## 2022-12-31 NOTE — Telephone Encounter (Signed)
Requested records electronically

## 2022-12-31 NOTE — Telephone Encounter (Signed)
error 

## 2023-01-09 ENCOUNTER — Ambulatory Visit
Admission: RE | Admit: 2023-01-09 | Discharge: 2023-01-09 | Disposition: A | Discharge status: Home / Self Care | Payer: PPO | Source: Ambulatory Visit | Attending: Internal Medicine | Admitting: Internal Medicine

## 2023-01-09 DIAGNOSIS — Z1231 Encounter for screening mammogram for malignant neoplasm of breast: Secondary | ICD-10-CM | POA: Diagnosis not present

## 2023-01-16 ENCOUNTER — Ambulatory Visit (INDEPENDENT_AMBULATORY_CARE_PROVIDER_SITE_OTHER): Payer: PPO | Admitting: *Deleted

## 2023-01-16 VITALS — Ht 65.0 in | Wt 138.0 lb

## 2023-01-16 DIAGNOSIS — Z0000 Encounter for general adult medical examination without abnormal findings: Secondary | ICD-10-CM | POA: Diagnosis not present

## 2023-01-16 NOTE — Progress Notes (Addendum)
Subjective:   Bridget [name] is a 89 y.o. female who presents for Medicare Annual (Subsequent) preventive examination.  Visit Complete: Virtual  I connected with  Bridget [name] on 01/16/23 by a audio enabled telemedicine application and verified that I am speaking with the correct person using two identifiers.  Patient Location: Home  Provider Location: Office/Clinic  I discussed the limitations of evaluation and management by telemedicine. The patient expressed understanding and agreed to proceed.   Review of Systems     Cardiac Risk Factors include: advanced age (>30men, >42 women);hypertension     Objective:    Today's Vitals   01/16/23 0919  Weight: 138 lb (62.6 kg)  Height: 5\' 5"  (1.651 m)   Body mass index is 22.96 kg/m.     01/16/2023    9:41 AM 11/26/2021   10:31 AM 11/23/2020   10:00 AM 10/29/2017   11:41 AM 09/25/2017   10:31 AM 07/25/2015    9:59 AM 04/09/2015    1:23 PM  Advanced Directives  Does Patient Have a Medical Advance Directive? Yes Yes Yes No No Yes Yes  Type of Estate agent of [place];Living will Healthcare Power of [place] Living will   Healthcare Power of [place];Living will Living will  Does patient want to make changes to medical advance directive?  No - Patient declined No - Patient declined   No - Patient declined No - Patient declined  Copy of Healthcare Power of Attorney in Chart? No - copy requested No - copy requested    No - copy requested   Would patient like information on creating a medical advance directive?    No - Patient declined       Current Medications (verified) Outpatient Encounter Medications as of 01/16/2023  Medication Sig   acetaminophen (TYLENOL) 500 MG tablet Take 1,000 mg by mouth daily as needed.   amLODipine (NORVASC) 2.5 MG tablet TAKE 1 TABLET BY MOUTH ONCE DAILY   cholecalciferol (VITAMIN D3) 25 MCG (1000 UNIT) tablet Take 1,000 Units by mouth daily.   Multiple Vitamins-Minerals (CENTRUM  SILVER PO) Take 1 tablet by mouth daily.     pantoprazole (PROTONIX) 40 MG tablet TAKE 1 TABLET BY MOUTH ONCE DAILY   rosuvastatin (CRESTOR) 10 MG tablet Take 1 tablet (10 mg total) by mouth 2 (two) times a week.   vitamin C (ASCORBIC ACID) 500 MG tablet Take 500 mg by mouth daily.   No facility-administered encounter medications on file as of 01/16/2023.    Allergies (verified) Sulfa antibiotics   History: Past Medical History:  Diagnosis Date   Diverticulosis of colon without diverticulitis    GERD (gastroesophageal reflux disease)    Past Surgical History:  Procedure Laterality Date   ABDOMINAL HYSTERECTOMY     Not due to cancer, due to fibroids, BSO   OOPHORECTOMY     Bilateral   TOE SURGERY  06/2015   R Big toe Ingrown toenail removed     Family History  Problem Relation Age of Onset   Heart failure Mother        Massive MI   Cancer Brother        prostate   Cancer Sister        colon   Cancer Sister        lung   Cancer Sister        bone   Cancer Brother        lung (smoker)   Breast cancer Neg Hx  Social History   Socioeconomic History   Marital status: Widowed    Spouse name: Not on file   Number of children: Not on file   Years of education: Not on file   Highest education level: Not on file  Occupational History   Occupation: Part-Time    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  Tobacco Use   Smoking status: Never   Smokeless tobacco: Never  Substance and Sexual Activity   Alcohol use: No   Drug use: No   Sexual activity: Not Currently  Other Topics Concern   Not on file  Social History Narrative   Not on file   Social Determinants of Health   Financial Resource Strain: Low Risk  (01/16/2023)   Overall Financial Resource Strain (CARDIA)    Difficulty of Paying Living Expenses: Not hard at all  Food Insecurity: No Food Insecurity (01/16/2023)   Hunger Vital Sign    Worried About Running Out of Food in the Last Year: Never true     Ran Out of Food in the Last Year: Never true  Transportation Needs: No Transportation Needs (01/16/2023)   PRAPARE - Administrator, Civil Service (Medical): No    Lack of Transportation (Non-Medical): No  Physical Activity: Insufficiently Active (01/16/2023)   Exercise Vital Sign    Days of Exercise per Week: 3 days    Minutes of Exercise per Session: 10 min  Stress: No Stress Concern Present (01/16/2023)   [employer] of Occupational Health - Occupational Stress Questionnaire    Feeling of Stress : Not at all  Social Connections: Moderately Integrated (01/16/2023)   Social Connection and Isolation Panel [NHANES]    Frequency of Communication with Friends and Family: More than three times a week    Frequency of Social Gatherings with Friends and Family: More than three times a week    Attends Religious Services: More than 4 times per year    Active Member of [employer] or Organizations: Yes    Attends Banker Meetings: More than 4 times per year    Marital Status: Widowed    Tobacco Counseling Counseling given: Not Answered   Clinical Intake:  Pre-visit preparation completed: Yes  Pain : No/denies pain     BMI - recorded: 22.96 Nutritional Status: BMI of 19-24  Normal Nutritional Risks: None Diabetes: No  How often do you need to have someone help you when you read instructions, pamphlets, or other written materials from your doctor or pharmacy?: 1 - Never  Interpreter Needed?: No  Comments: R. Parthenia Tellefsen LPN   Activities of Daily Living    01/16/2023    9:29 AM  In your present state of health, do you have any difficulty performing the following activities:  Hearing? 0  Vision? 0  Difficulty concentrating or making decisions? 0  Walking or climbing stairs? 1  Dressing or bathing? 0  Doing errands, shopping? 0  Preparing Food and eating ? N  Using the Toilet? N  In the past six months, have you accidently leaked urine? N  Do you have  problems with loss of bowel control? N  Managing your Medications? N  Managing your Finances? N  Housekeeping or managing your Housekeeping? N    Patient Care Team: Sherlene Shams, MD as PCP - General (Internal Medicine)  Indicate any recent Medical Services you may have received from other than [facility] providers in the past year (date may be approximate).  Assessment:   This is a routine wellness examination for Nastashia.  Hearing/Vision screen Hearing Screening - Comments:: No issues Vision Screening - Comments:: Glasses, needs to get new ones  Dietary issues and exercise activities discussed:     Goals Addressed             This Visit's Progress    Patient Stated       None at this time      Depression Screen    01/16/2023    9:33 AM 09/25/2022   11:02 AM 09/03/2022    8:04 AM 02/03/2022    9:42 AM 11/26/2021    9:17 AM 01/18/2021    9:04 AM 11/23/2020    9:57 AM  PHQ 2/9 Scores  PHQ - 2 Score 0 0 0 0 0 0 0  PHQ- 9 Score 0     2     Fall Risk    01/16/2023    9:46 AM 09/25/2022   11:02 AM 09/03/2022    8:04 AM 02/03/2022    9:41 AM 11/26/2021    9:16 AM  Fall Risk   Falls in the past year? 0 0 0 0 0  Number falls in past yr: 0 0 0  0  Injury with Fall? 0 0 0    Risk for fall due to : No Fall Risks No Fall Risks No Fall Risks No Fall Risks   Follow up Falls prevention discussed;Falls evaluation completed;Education provided Falls evaluation completed Falls evaluation completed Falls evaluation completed Falls evaluation completed    MEDICARE RISK AT HOME:  Medicare Risk at Home - 01/16/23 0946     Any stairs in or around the home? No    If so, are there any without handrails? No    Home free of loose throw rugs in walkways, pet beds, electrical cords, etc? No    Adequate lighting in your home to reduce risk of falls? Yes    Life alert? No    Use of a cane, walker or w/c? Yes   only when goes to [employer] bars in the bathroom? No    Shower chair or bench in  shower? No    Elevated toilet seat or a handicapped toilet? No              Cognitive Function:    07/25/2015   10:09 AM  MMSE - Mini Mental State Exam  Orientation to time 5  Orientation to Place 5  Registration 3  Attention/ Calculation 5  Recall 3  Language- name 2 objects 2  Language- repeat 1  Language- follow 3 step command 3  Language- read & follow direction 1  Write a sentence 1  Copy design 1  Total score 30        01/16/2023    9:42 AM 11/26/2021    9:10 AM 10/29/2017   11:43 AM  6CIT Screen  What Year? 0 points 0 points 0 points  What month? 0 points 0 points 0 points  What time? 0 points 0 points 0 points  Count back from 20 0 points 0 points 0 points  Months in reverse 0 points 0 points 0 points  Repeat phrase   0 points  Total Score   0 points    Immunizations Immunization History  Administered Date(s) Administered   PFIZER(Purple Top)SARS-COV-2 Vaccination 08/22/2019, 09/12/2019, 06/06/2020, 01/09/2021   Pneumococcal Conjugate-13 10/12/2015   Pneumococcal Polysaccharide-23 02/25/2012, 08/26/2018   Td 01/14/2017  TDAP status: Up to date  Flu Vaccine status: Declined, Education has been provided regarding the importance of this vaccine but patient still declined. Advised may receive this vaccine at local pharmacy or Health Dept. Aware to provide a copy of the vaccination record if obtained from local pharmacy or Health Dept. Verbalized acceptance and understanding.  Pneumococcal vaccine status: Up to date  Covid-19 vaccine status: Information provided on how to obtain vaccines.   Qualifies for Shingles Vaccine? Yes   Zostavax completed Yes   Shingrix Completed?: Yes per patient had 2, will get dates and advise  Screening Tests Health Maintenance  Topic Date Due   Zoster Vaccines- Shingrix (1 of 2) Never done   COVID-19 Vaccine (5 - 2023-24 season) 03/21/2022   INFLUENZA VACCINE  02/19/2023   DTaP/Tdap/Td (2 - Tdap) 01/15/2027    Pneumonia Vaccine 29+ Years old  Completed   DEXA SCAN  Completed   HPV VACCINES  Aged Out    Health Maintenance  Health Maintenance Due  Topic Date Due   Zoster Vaccines- Shingrix (1 of 2) Never done   COVID-19 Vaccine (5 - 2023-24 season) 03/21/2022      Mammogram status: Completed 01/09/23 per patient. Repeat every year  Bone Density status: Completed 04/24/22. Results reflect: Bone density results: OSTEOPENIA. Repeat every 2 years.   Additional Screening:  Hepatitis C Screening: does not qualify;   Vision Screening: Recommended annual ophthalmology exams for early detection of glaucoma and other disorders of the eye. Is the patient up to date with their annual eye exam?  Yes  Who is the provider or what is the name of the office in which the patient attends annual eye exams? [facility] If pt is not established with a provider, would they like to be referred to a provider to establish care? No .   Dental Screening: Recommended annual dental exams for proper oral hygiene  Community Resource Referral / Chronic Care Management: CRR required this visit?  No   CCM required this visit?  No     Plan:     I have personally reviewed and noted the following in the patient's chart:   Medical and social history Use of alcohol, tobacco or illicit drugs  Current medications and supplements including opioid prescriptions. Patient is not currently taking opioid prescriptions. Functional ability and status Nutritional status Physical activity Advanced directives List of other physicians Hospitalizations, surgeries, and ER visits in previous 12 months Vitals Screenings to include cognitive, depression, and falls Referrals and appointments  In addition, I have reviewed and discussed with patient certain preventive protocols, quality metrics, and best practice recommendations. A written personalized care plan for preventive services as well as general preventive health  recommendations were provided to patient.     Sydell Axon, LPN   1/61/0960   After Visit Summary: (Declined) Due to this being a telephonic visit, with patients personalized plan was offered to patient but patient Declined AVS at this time   Nurse Notes: Patient needs a new referral to a doctor to see about her calcium level and thyroid. Patient stated the doctor she was seeing is retiring but does not remember his name.    I have reviewed the above information and agree with above.   Duncan Dull, MD

## 2023-01-16 NOTE — Patient Instructions (Signed)
Bridget [name] , Thank you for taking time to come for your Medicare Wellness Visit. I appreciate your ongoing commitment to your health goals. Please review the following plan we discussed and let me know if I can assist you in the future.   These are the goals we discussed:  Goals      Maintain Healthy Lifestyle     Stay active Healthy diet Stay hydrated     Patient Stated     None at this time        This is a list of the screening recommended for you and due dates:  Health Maintenance  Topic Date Due   Zoster (Shingles) Vaccine (1 of 2) Never done   COVID-19 Vaccine (5 - 2023-24 season) 03/21/2022   Flu Shot  02/19/2023   DTaP/Tdap/Td vaccine (2 - Tdap) 01/15/2027   Pneumonia Vaccine  Completed   DEXA scan (bone density measurement)  Completed   HPV Vaccine  Aged Out    Advanced directives: will  bring to the office at next visit  Conditions/risks identified: none  Next appointment: Follow up in one year for your annual wellness visit 01/19/24 @ 9:00   Preventive Care 65 Years and Older, Female Preventive care refers to lifestyle choices and visits with your health care provider that can promote health and wellness. What does preventive care include? A yearly physical exam. This is also called an annual well check. Dental exams once or twice a year. Routine eye exams. Ask your health care provider how often you should have your eyes checked. Personal lifestyle choices, including: Daily care of your teeth and gums. Regular physical activity. Eating a healthy diet. Avoiding tobacco and drug use. Limiting alcohol use. Practicing safe sex. Taking low-dose aspirin every day. Taking vitamin and mineral supplements as recommended by your health care provider. What happens during an annual well check? The services and screenings done by your health care provider during your annual well check will depend on your age, overall health, lifestyle risk factors, and family history  of disease. Counseling  Your health care provider may ask you questions about your: Alcohol use. Tobacco use. Drug use. Emotional well-being. Home and relationship well-being. Sexual activity. Eating habits. History of falls. Memory and ability to understand (cognition). Work and work Astronomer. Reproductive health. Screening  You may have the following tests or measurements: Height, weight, and BMI. Blood pressure. Lipid and cholesterol levels. These may be checked every 5 years, or more frequently if you are over 2 years old. Skin check. Lung cancer screening. You may have this screening every year starting at age 60 if you have a 30-pack-year history of smoking and currently smoke or have quit within the past 15 years. Fecal occult blood test (FOBT) of the stool. You may have this test every year starting at age 51. Flexible sigmoidoscopy or colonoscopy. You may have a sigmoidoscopy every 5 years or a colonoscopy every 10 years starting at age 79. Hepatitis C blood test. Hepatitis B blood test. Sexually transmitted disease (STD) testing. Diabetes screening. This is done by checking your blood sugar (glucose) after you have not eaten for a while (fasting). You may have this done every 1-3 years. Bone density scan. This is done to screen for osteoporosis. You may have this done starting at age 23. Mammogram. This may be done every 1-2 years. Talk to your health care provider about how often you should have regular mammograms. Talk with your health care provider about your test  results, treatment options, and if necessary, the need for more tests. Vaccines  Your health care provider may recommend certain vaccines, such as: Influenza vaccine. This is recommended every year. Tetanus, diphtheria, and acellular pertussis (Tdap, Td) vaccine. You may need a Td booster every 10 years. Zoster vaccine. You may need this after age 36. Pneumococcal 13-valent conjugate (PCV13) vaccine. One  dose is recommended after age 18. Pneumococcal polysaccharide (PPSV23) vaccine. One dose is recommended after age 60. Talk to your health care provider about which screenings and vaccines you need and how often you need them. This information is not intended to replace advice given to you by your health care provider. Make sure you discuss any questions you have with your health care provider. Document Released: 08/03/2015 Document Revised: 03/26/2016 Document Reviewed: 05/08/2015 Elsevier Interactive Patient Education  2017 [place] Prevention in the Home Falls can cause injuries. They can happen to people of all ages. There are many things you can do to make your home safe and to help prevent falls. What can I do on the outside of my home? Regularly fix the edges of walkways and driveways and fix any cracks. Remove anything that might make you trip as you walk through a door, such as a raised step or threshold. Trim any bushes or trees on the path to your home. Use bright outdoor lighting. Clear any walking paths of anything that might make someone trip, such as rocks or tools. Regularly check to see if handrails are loose or broken. Make sure that both sides of any steps have handrails. Any raised decks and porches should have guardrails on the edges. Have any leaves, snow, or ice cleared regularly. Use sand or salt on walking paths during winter. Clean up any spills in your garage right away. This includes oil or grease spills. What can I do in the bathroom? Use night lights. Install grab bars by the toilet and in the tub and shower. Do not use towel bars as grab bars. Use non-skid mats or decals in the tub or shower. If you need to sit down in the shower, use a plastic, non-slip stool. Keep the floor dry. Clean up any water that spills on the floor as soon as it happens. Remove soap buildup in the tub or shower regularly. Attach bath mats securely with double-sided  non-slip rug tape. Do not have throw rugs and other things on the floor that can make you trip. What can I do in the bedroom? Use night lights. Make sure that you have a light by your bed that is easy to reach. Do not use any sheets or blankets that are too big for your bed. They should not hang down onto the floor. Have a firm chair that has side arms. You can use this for support while you get dressed. Do not have throw rugs and other things on the floor that can make you trip. What can I do in the kitchen? Clean up any spills right away. Avoid walking on wet floors. Keep items that you use a lot in easy-to-reach places. If you need to reach something above you, use a strong step stool that has a grab bar. Keep electrical cords out of the way. Do not use floor polish or wax that makes floors slippery. If you must use wax, use non-skid floor wax. Do not have throw rugs and other things on the floor that can make you trip. What can I do with my stairs?  Do not leave any items on the stairs. Make sure that there are handrails on both sides of the stairs and use them. Fix handrails that are broken or loose. Make sure that handrails are as long as the stairways. Check any carpeting to make sure that it is firmly attached to the stairs. Fix any carpet that is loose or worn. Avoid having throw rugs at the top or bottom of the stairs. If you do have throw rugs, attach them to the floor with carpet tape. Make sure that you have a light switch at the top of the stairs and the bottom of the stairs. If you do not have them, ask someone to add them for you. What else can I do to help prevent falls? Wear shoes that: Do not have high heels. Have rubber bottoms. Are comfortable and fit you well. Are closed at the toe. Do not wear sandals. If you use a stepladder: Make sure that it is fully opened. Do not climb a closed stepladder. Make sure that both sides of the stepladder are locked into place. Ask  someone to hold it for you, if possible. Clearly mark and make sure that you can see: Any grab bars or handrails. First and last steps. Where the edge of each step is. Use tools that help you move around (mobility aids) if they are needed. These include: Canes. Walkers. Scooters. Crutches. Turn on the lights when you go into a dark area. Replace any light bulbs as soon as they burn out. Set up your furniture so you have a clear path. Avoid moving your furniture around. If any of your floors are uneven, fix them. If there are any pets around you, be aware of where they are. Review your medicines with your doctor. Some medicines can make you feel dizzy. This can increase your chance of falling. Ask your doctor what other things that you can do to help prevent falls. This information is not intended to replace advice given to you by your health care provider. Make sure you discuss any questions you have with your health care provider. Document Released: 05/03/2009 Document Revised: 12/13/2015 Document Reviewed: 08/11/2014 Elsevier Interactive Patient Education  2017 [employer].

## 2023-01-29 ENCOUNTER — Other Ambulatory Visit: Payer: Self-pay | Admitting: Internal Medicine

## 2023-03-05 ENCOUNTER — Encounter: Payer: Self-pay | Admitting: Internal Medicine

## 2023-03-16 ENCOUNTER — Other Ambulatory Visit: Payer: Self-pay | Admitting: Internal Medicine

## 2023-04-01 ENCOUNTER — Other Ambulatory Visit (INDEPENDENT_AMBULATORY_CARE_PROVIDER_SITE_OTHER): Payer: PPO

## 2023-04-01 LAB — COMPREHENSIVE METABOLIC PANEL
ALT: 15 U/L (ref 0–35)
AST: 20 U/L (ref 0–37)
Albumin: 4.2 g/dL (ref 3.5–5.2)
Alkaline Phosphatase: 58 U/L (ref 39–117)
BUN: 10 mg/dL (ref 6–23)
CO2: 26 meq/L (ref 19–32)
Calcium: 10.4 mg/dL (ref 8.4–10.5)
Chloride: 106 meq/L (ref 96–112)
Creatinine, Ser: 0.73 mg/dL (ref 0.40–1.20)
GFR: 72.45 mL/min (ref >60.00)
Glucose, Bld: 110 mg/dL — ABNORMAL HIGH (ref 70–99)
Potassium: 3.6 meq/L (ref 3.5–5.1)
Sodium: 141 meq/L (ref 135–145)
Total Bilirubin: 0.7 mg/dL (ref 0.2–1.2)
Total Protein: 6.8 g/dL (ref 6.0–8.3)

## 2023-04-08 ENCOUNTER — Other Ambulatory Visit: Payer: Self-pay | Admitting: Internal Medicine

## 2023-04-09 DIAGNOSIS — E213 Hyperparathyroidism, unspecified: Secondary | ICD-10-CM | POA: Diagnosis not present

## 2023-04-09 DIAGNOSIS — E041 Nontoxic single thyroid nodule: Secondary | ICD-10-CM | POA: Diagnosis not present

## 2023-04-14 DIAGNOSIS — H40003 Preglaucoma, unspecified, bilateral: Secondary | ICD-10-CM | POA: Diagnosis not present

## 2023-04-14 DIAGNOSIS — Z961 Presence of intraocular lens: Secondary | ICD-10-CM | POA: Diagnosis not present

## 2023-04-22 DIAGNOSIS — Y92009 Unspecified place in unspecified non-institutional (private) residence as the place of occurrence of the external cause: Secondary | ICD-10-CM | POA: Diagnosis not present

## 2023-04-22 DIAGNOSIS — M79672 Pain in left foot: Secondary | ICD-10-CM | POA: Diagnosis not present

## 2023-04-22 DIAGNOSIS — M25572 Pain in left ankle and joints of left foot: Secondary | ICD-10-CM | POA: Diagnosis not present

## 2023-04-22 DIAGNOSIS — W010XXA Fall on same level from slipping, tripping and stumbling without subsequent striking against object, initial encounter: Secondary | ICD-10-CM | POA: Diagnosis not present

## 2023-04-22 DIAGNOSIS — M85872 Other specified disorders of bone density and structure, left ankle and foot: Secondary | ICD-10-CM | POA: Diagnosis not present

## 2023-04-22 DIAGNOSIS — M7989 Other specified soft tissue disorders: Secondary | ICD-10-CM | POA: Diagnosis not present

## 2023-04-28 DIAGNOSIS — M79672 Pain in left foot: Secondary | ICD-10-CM | POA: Diagnosis not present

## 2023-04-28 DIAGNOSIS — S92412D Displaced fracture of proximal phalanx of left great toe, subsequent encounter for fracture with routine healing: Secondary | ICD-10-CM | POA: Diagnosis not present

## 2023-04-29 ENCOUNTER — Other Ambulatory Visit: Payer: Self-pay | Admitting: Internal Medicine

## 2023-05-08 ENCOUNTER — Other Ambulatory Visit: Payer: Self-pay | Admitting: Otolaryngology

## 2023-05-08 DIAGNOSIS — E041 Nontoxic single thyroid nodule: Secondary | ICD-10-CM

## 2023-05-08 DIAGNOSIS — Z7689 Persons encountering health services in other specified circumstances: Secondary | ICD-10-CM | POA: Diagnosis not present

## 2023-05-08 DIAGNOSIS — E042 Nontoxic multinodular goiter: Secondary | ICD-10-CM | POA: Diagnosis not present

## 2023-05-08 DIAGNOSIS — E213 Hyperparathyroidism, unspecified: Secondary | ICD-10-CM | POA: Diagnosis not present

## 2023-05-14 ENCOUNTER — Ambulatory Visit
Admission: RE | Admit: 2023-05-14 | Discharge: 2023-05-14 | Disposition: A | Discharge status: Home / Self Care | Payer: PPO | Source: Ambulatory Visit | Attending: Otolaryngology | Admitting: Otolaryngology

## 2023-05-14 DIAGNOSIS — E041 Nontoxic single thyroid nodule: Secondary | ICD-10-CM

## 2023-05-14 DIAGNOSIS — E042 Nontoxic multinodular goiter: Secondary | ICD-10-CM | POA: Diagnosis not present

## 2023-05-19 DIAGNOSIS — M79672 Pain in left foot: Secondary | ICD-10-CM | POA: Diagnosis not present

## 2023-05-19 DIAGNOSIS — S92412D Displaced fracture of proximal phalanx of left great toe, subsequent encounter for fracture with routine healing: Secondary | ICD-10-CM | POA: Diagnosis not present

## 2023-05-25 ENCOUNTER — Telehealth: Payer: Self-pay | Admitting: Internal Medicine

## 2023-05-25 NOTE — Telephone Encounter (Signed)
The patient called asking if Dr. Darrick Huntsman had received information from Dr. Gregary Cromer office, regarding referring her to an oncologist and her Thyroid U/S results. She will follow up with Dr. Darrick Huntsman on 06/01/23.

## 2023-05-27 NOTE — Telephone Encounter (Signed)
LMTCB

## 2023-05-27 NOTE — Telephone Encounter (Signed)
Pt is aware and gave a verbal understanding.  

## 2023-06-01 ENCOUNTER — Encounter: Payer: Self-pay | Admitting: Internal Medicine

## 2023-06-01 ENCOUNTER — Ambulatory Visit (INDEPENDENT_AMBULATORY_CARE_PROVIDER_SITE_OTHER): Payer: PPO | Admitting: Internal Medicine

## 2023-06-01 VITALS — BP 128/78 | HR 74 | Ht 65.0 in | Wt 142.6 lb

## 2023-06-01 DIAGNOSIS — N3941 Urge incontinence: Secondary | ICD-10-CM | POA: Diagnosis not present

## 2023-06-01 DIAGNOSIS — E042 Nontoxic multinodular goiter: Secondary | ICD-10-CM | POA: Diagnosis not present

## 2023-06-01 DIAGNOSIS — R350 Frequency of micturition: Secondary | ICD-10-CM

## 2023-06-01 DIAGNOSIS — I10 Essential (primary) hypertension: Secondary | ICD-10-CM

## 2023-06-01 DIAGNOSIS — E785 Hyperlipidemia, unspecified: Secondary | ICD-10-CM | POA: Diagnosis not present

## 2023-06-01 DIAGNOSIS — E210 Primary hyperparathyroidism: Secondary | ICD-10-CM

## 2023-06-01 LAB — LIPID PANEL
Cholesterol: 148 mg/dL (ref 0–200)
HDL: 56.8 mg/dL (ref >39.00)
LDL Cholesterol: 64 mg/dL (ref 0–99)
NonHDL: 90.86
Total CHOL/HDL Ratio: 3
Triglycerides: 136 mg/dL (ref 0.0–149.0)
VLDL: 27.2 mg/dL (ref 0.0–40.0)

## 2023-06-01 LAB — COMPREHENSIVE METABOLIC PANEL WITH GFR
ALT: 14 U/L (ref 0–35)
AST: 21 U/L (ref 0–37)
Albumin: 4.5 g/dL (ref 3.5–5.2)
Alkaline Phosphatase: 62 U/L (ref 39–117)
BUN: 13 mg/dL (ref 6–23)
CO2: 29 meq/L (ref 19–32)
Calcium: 10.4 mg/dL (ref 8.4–10.5)
Chloride: 103 meq/L (ref 96–112)
Creatinine, Ser: 0.73 mg/dL (ref 0.40–1.20)
GFR: 72.36 mL/min
Glucose, Bld: 95 mg/dL (ref 70–99)
Potassium: 3.8 meq/L (ref 3.5–5.1)
Sodium: 139 meq/L (ref 135–145)
Total Bilirubin: 0.5 mg/dL (ref 0.2–1.2)
Total Protein: 6.9 g/dL (ref 6.0–8.3)

## 2023-06-01 LAB — URINALYSIS, ROUTINE W REFLEX MICROSCOPIC
Bilirubin Urine: NEGATIVE
Hgb urine dipstick: NEGATIVE
Ketones, ur: NEGATIVE
Leukocytes,Ua: NEGATIVE
Nitrite: NEGATIVE
RBC / HPF: NONE SEEN (ref 0–?)
Specific Gravity, Urine: 1.01 (ref 1.000–1.030)
Total Protein, Urine: NEGATIVE
Urine Glucose: NEGATIVE
Urobilinogen, UA: 0.2 (ref 0.0–1.0)
WBC, UA: NONE SEEN (ref 0–?)
pH: 6 (ref 5.0–8.0)

## 2023-06-01 LAB — MICROALBUMIN / CREATININE URINE RATIO
Creatinine,U: 67.2 mg/dL
Microalb Creat Ratio: 1 mg/g (ref 0.0–30.0)
Microalb, Ur: <0.7 mg/dL (ref 0.0–1.9)

## 2023-06-01 LAB — LDL CHOLESTEROL, DIRECT: Direct LDL: 70 mg/dL

## 2023-06-01 LAB — TSH: TSH: 1.01 u[IU]/mL (ref 0.35–5.50)

## 2023-06-01 LAB — HEMOGLOBIN A1C: Hgb A1c MFr Bld: 5.4 % (ref 4.6–6.5)

## 2023-06-01 NOTE — Assessment & Plan Note (Signed)
Secondary to hyperparathyroidism.   Lab Results  Component Value Date   PTH 77 02/20/2022   CALCIUM 10.4 06/01/2023

## 2023-06-01 NOTE — Assessment & Plan Note (Signed)
Well controlled on low dose amlodipine . Renal function stable, no changes today.

## 2023-06-01 NOTE — Patient Instructions (Signed)
I have requested records from Dr Andee Poles to see [facility] he has referred you to and WHY  You may have a bladder infection.  We are checking today

## 2023-06-01 NOTE — Progress Notes (Signed)
1)   Subjective:  Patient ID: Bridget [name], female    DOB: 1931/03/10  Age: 89 y.o. MRN: [id number]  CC: The primary encounter diagnosis was Essential hypertension. Diagnoses of Primary hyperparathyroidism (HCC), Dyslipidemia, Urinary frequency, Multinodular goiter, Hypercalcemia, and Urinary incontinence, urge were also pertinent to this visit.   HPI Bridget [name] presents for  Chief Complaint  Patient presents with   Medical Management of Chronic Issues   1) Hypertension: patient checks blood pressure twice weekly at home.  Readings have been for the most part <130/80 at rest . Patient is following a reduced salt diet most days and is taking medications as prescribed   2) urinary urgency,  doesn't feel like she is emptying her bladder completely.   Symptoms present since mid September .   3) HISTORY OF LEFT GREAT TOE FRACTURE IN LATE AUGUST. treated by podiatry  comservatively   Outpatient Medications Prior to Visit  Medication Sig Dispense Refill   acetaminophen (TYLENOL) 500 MG tablet Take 1,000 mg by mouth daily as needed.     amLODipine (NORVASC) 2.5 MG tablet TAKE 1 TABLET BY MOUTH ONCE DAILY 30 tablet 1   cholecalciferol (VITAMIN D3) 25 MCG (1000 UNIT) tablet Take 1,000 Units by mouth daily.     Multiple Vitamins-Minerals (CENTRUM SILVER PO) Take 1 tablet by mouth daily.       pantoprazole (PROTONIX) 40 MG tablet TAKE 1 TABLET BY MOUTH ONCE DAILY 90 tablet 1   rosuvastatin (CRESTOR) 10 MG tablet TAKE 1 TABLET BY MOUTH 2 TIMES A WEEK 25 tablet 2   vitamin C (ASCORBIC ACID) 500 MG tablet Take 500 mg by mouth daily.     No facility-administered medications prior to visit.    Review of Systems;  Patient denies headache, fevers, malaise, unintentional weight loss, skin rash, eye pain, sinus congestion and sinus pain, sore throat, dysphagia,  hemoptysis , cough, dyspnea, wheezing, chest pain, palpitations, orthopnea, edema, abdominal pain, nausea, melena, diarrhea,  constipation, flank pain, dysuria, hematuria, , numbness, tingling, seizures,  Focal weakness, Loss of consciousness,  Tremor, insomnia, depression, anxiety, and suicidal ideation.      Objective:  BP 128/78   Pulse 74   Ht 5\' 5"  (1.651 m)   Wt 142 lb 9.6 oz (64.7 kg)   SpO2 99%   BMI 23.73 kg/m   BP Readings from Last 3 Encounters:  06/01/23 128/78  12/16/22 100/66  11/03/22 138/78    Wt Readings from Last 3 Encounters:  06/01/23 142 lb 9.6 oz (64.7 kg)  01/16/23 138 lb (62.6 kg)  12/16/22 139 lb 3.2 oz (63.1 kg)    Physical Exam Vitals reviewed.  Constitutional:      General: She is not in acute distress.    Appearance: Normal appearance. She is normal weight. She is not ill-appearing, toxic-appearing or diaphoretic.  HENT:     Head: Normocephalic.  Eyes:     General: No scleral icterus.       Right eye: No discharge.        Left eye: No discharge.     Conjunctiva/sclera: Conjunctivae normal.  Cardiovascular:     Rate and Rhythm: Normal rate and regular rhythm.     Heart sounds: Normal heart sounds.  Pulmonary:     Effort: Pulmonary effort is normal. No respiratory distress.     Breath sounds: Normal breath sounds.  Musculoskeletal:        General: Normal range of motion.  Skin:    General: Skin is  warm and dry.  Neurological:     General: No focal deficit present.     Mental Status: She is alert and oriented to person, place, and time. Mental status is at baseline.  Psychiatric:        Mood and Affect: Mood normal.        Behavior: Behavior normal.        Thought Content: Thought content normal.        Judgment: Judgment normal.    Lab Results  Component Value Date   HGBA1C 5.4 06/01/2023   HGBA1C 5.5 08/26/2018    Lab Results  Component Value Date   CREATININE 0.73 06/01/2023   CREATININE 0.73 04/01/2023   CREATININE 0.66 09/03/2022    Lab Results  Component Value Date   WBC 6.2 09/03/2022   HGB 13.1 09/03/2022   HCT 38.5 09/03/2022   PLT  302.0 09/03/2022   GLUCOSE 95 06/01/2023   CHOL 148 06/01/2023   TRIG 136.0 06/01/2023   HDL 56.80 06/01/2023   LDLDIRECT 70.0 06/01/2023   LDLCALC 64 06/01/2023   ALT 14 06/01/2023   AST 21 06/01/2023   NA 139 06/01/2023   K 3.8 06/01/2023   CL 103 06/01/2023   CREATININE 0.73 06/01/2023   BUN 13 06/01/2023   CO2 29 06/01/2023   TSH 1.01 06/01/2023   HGBA1C 5.4 06/01/2023   MICROALBUR <0.7 06/01/2023    US THYROID  Result Date: 05/25/2023 CLINICAL DATA:  Prior ultrasound follow-up. EXAM: THYROID ULTRASOUND TECHNIQUE: Ultrasound examination of the thyroid gland and adjacent soft tissues was performed. COMPARISON:  08/22/2013, 10/10/2013 FINDINGS: Parenchymal Echotexture: Moderately heterogeneous Isthmus: 1.5 cm ,previously 0.3 cm Right lobe: 6.1 x 2.1 x 2.8 cm ,previously 5.6 x 2.1 x 2.3 cm Left lobe: 9.5 x 4.2 x 3.7 cm ,previously 6.7 x 2.6 x 2.3 cm ________________________________________________________ Estimated total number of nodules >/= 1 cm: 2 Number of spongiform nodules >/=  2 cm not described below (TR1): 0 Number of mixed cystic and solid nodules >/= 1.5 cm not described below (TR2): 0 _________________________________________________________ Similar benign appearing spongiform nodule in the right superior thyroid measuring up to 0.7 cm, previously 1.2 cm. Slight interval increased size of previously biopsied right mid solid thyroid nodule (labeled 2, 5.1 cm, previously 4.1 cm). Interval enlargement of previously biopsied left mid solid thyroid nodule (labeled 3, 8.6 cm, previously 3.0 cm). No cervical lymphadenopathy. IMPRESSION: 1. Similar appearing multinodular thyroid. 2. Interval enlargement of previously biopsied right mid (labeled 2, 5.1 cm, previously 4.1 cm) and left mid (labeled 3, 8.6 cm, previously 3.0 cm) thyroid nodules. Recommend correlation with prior biopsy results. The above is in keeping with the ACR TI-RADS recommendations - J Am Coll Radiol 2017;14:587-595.  Marliss Coots, MD [facility] Radiology Electronically Signed   By: Marliss Coots M.D.   On: 05/25/2023 10:50    Assessment & Plan:  .Essential hypertension Assessment & Plan: Well controlled on low dose amlodipine . Renal function stable, no changes today.  Orders: -     Comprehensive metabolic panel -     Microalbumin / creatinine urine ratio  Primary hyperparathyroidism (HCC) Assessment & Plan: With worsening osteopenia ( T score -2.4 forearm,  DEXA Oct 2023).  Endocrinology referral in progress  Lab Results  Component Value Date   PTH 77 02/20/2022   CALCIUM 10.4 06/01/2023     Orders: -     TSH  Dyslipidemia -     Lipid panel -  LDL cholesterol, direct -     Hemoglobin A1c  Urinary frequency -     Urinalysis, Routine w reflex microscopic -     Urine Culture  Multinodular goiter Assessment & Plan: Repeat ultrasound Oct 2024 notes increased size of both lobes and increased size pf previously biopsied nodule. Patient states that she was referred to oncology but there is no referral I nchart.  Records from Dr Andee Poles requested    Hypercalcemia Assessment & Plan: Secondary to hyperparathyroidism.   Lab Results  Component Value Date   PTH 77 02/20/2022   CALCIUM 10.4 06/01/2023      Urinary incontinence, urge Assessment & Plan: With feeling of incomplete bladder emptying . UA with micro and culture ordered       I provided 30 minutes of face-to-face time during this encounter reviewing patient's last visit with me, patient's  most recent visit with cardiology,  nephrology,  and neurology,  recent surgical and non surgical procedures, previous  labs and imaging studies, counseling on currently addressed issues,  and post visit ordering to diagnostics and therapeutics .   Follow-up: No follow-ups on file.   Sherlene Shams, MD

## 2023-06-01 NOTE — Assessment & Plan Note (Signed)
With worsening osteopenia ( T score -2.4 forearm,  DEXA Oct 2023).  Endocrinology referral in progress  Lab Results  Component Value Date   PTH 77 02/20/2022   CALCIUM 10.4 06/01/2023

## 2023-06-01 NOTE — Assessment & Plan Note (Signed)
With feeling of incomplete bladder emptying . UA with micro and culture ordered

## 2023-06-01 NOTE — Assessment & Plan Note (Signed)
Repeat ultrasound Oct 2024 notes increased size of both lobes and increased size pf previously biopsied nodule. Patient states that she was referred to oncology but there is no referral I nchart.  Records from Dr Andee Poles requested

## 2023-06-02 LAB — URINE CULTURE
MICRO NUMBER:: 15712857
Result:: NO GROWTH
SPECIMEN QUALITY:: ADEQUATE

## 2023-06-20 ENCOUNTER — Other Ambulatory Visit: Payer: Self-pay | Admitting: Internal Medicine

## 2023-08-17 ENCOUNTER — Other Ambulatory Visit: Payer: Self-pay | Admitting: Internal Medicine

## 2023-10-01 DIAGNOSIS — K080 Exfoliation of teeth due to systemic causes: Secondary | ICD-10-CM | POA: Diagnosis not present

## 2023-10-21 ENCOUNTER — Other Ambulatory Visit: Payer: Self-pay | Admitting: Internal Medicine

## 2023-10-26 ENCOUNTER — Ambulatory Visit (INDEPENDENT_AMBULATORY_CARE_PROVIDER_SITE_OTHER): Admitting: Internal Medicine

## 2023-10-26 ENCOUNTER — Encounter: Payer: Self-pay | Admitting: Internal Medicine

## 2023-10-26 VITALS — BP 130/78 | HR 73 | Ht 65.0 in | Wt 140.0 lb

## 2023-10-26 DIAGNOSIS — E042 Nontoxic multinodular goiter: Secondary | ICD-10-CM | POA: Diagnosis not present

## 2023-10-26 DIAGNOSIS — I10 Essential (primary) hypertension: Secondary | ICD-10-CM

## 2023-10-26 DIAGNOSIS — E7800 Pure hypercholesterolemia, unspecified: Secondary | ICD-10-CM | POA: Diagnosis not present

## 2023-10-26 DIAGNOSIS — Z9181 History of falling: Secondary | ICD-10-CM

## 2023-10-26 DIAGNOSIS — I708 Atherosclerosis of other arteries: Secondary | ICD-10-CM

## 2023-10-26 DIAGNOSIS — I700 Atherosclerosis of aorta: Secondary | ICD-10-CM

## 2023-10-26 DIAGNOSIS — S92404S Nondisplaced unspecified fracture of right great toe, sequela: Secondary | ICD-10-CM

## 2023-10-26 MED ORDER — ROSUVASTATIN CALCIUM 10 MG PO TABS: ORAL_TABLET | ORAL | 2 refills | Status: DC

## 2023-10-26 MED ORDER — PANTOPRAZOLE SODIUM 40 MG PO TBEC: 40.0000 mg | DELAYED_RELEASE_TABLET | Freq: Every day | ORAL | 1 refills | Status: DC

## 2023-10-26 NOTE — Patient Instructions (Addendum)
 Pain can elevate your blood pressure,  also motrin /aleve can do this ( but tylenol will not )   If your home readings start to stay above 140   increase amlodipine to 2 tablets  daily  (5 mg total) and let me know   I don't know why Dr Andee Poles is referring you to an endocrinologist,  so please call his office to find out why you have not been contacted yet

## 2023-10-26 NOTE — Progress Notes (Unsigned)
 Subjective:  Patient ID: Bridget [name], female    DOB: [birth year]  Age: 89 y.o. MRN: [id number]  CC: There were no encounter diagnoses.   HPI Bridget [name] presents for  Chief Complaint  Patient presents with   Medical Management of Chronic Issues    1) HTN:  taking amlodipine 2.5 mg only . Checks BP every day and home readings have been > 140 on current regimen. But she was in pain during readings. (Foot)  But readings have improved since she called   2) left great toe fracture in late October after a fall at home.  Treated with hard sole shoe by Alberteen Spindle.  Other toes bothering her and have become calloused on the end s  Social :  she is exercising every morning before getting out of be.  Does not walk daily for exercise, visits her sister daily at [facility] who cannot walk due to foot issues .  Patient is the oldest of 59 children,  has outlived 8 of them.  Had a big BDAY party at 89  3) diet reviewed:  several servings of veggies daily .    Outpatient Medications Prior to Visit  Medication Sig Dispense Refill   acetaminophen (TYLENOL) 500 MG tablet Take 1,000 mg by mouth daily as needed.     amLODipine (NORVASC) 2.5 MG tablet TAKE 1 TABLET BY MOUTH ONCE DAILY 30 tablet 3   cholecalciferol (VITAMIN D3) 25 MCG (1000 UNIT) tablet Take 1,000 Units by mouth daily.     Multiple Vitamins-Minerals (CENTRUM SILVER PO) Take 1 tablet by mouth daily.       pantoprazole (PROTONIX) 40 MG tablet TAKE 1 TABLET BY MOUTH ONCE DAILY 90 tablet 1   rosuvastatin (CRESTOR) 10 MG tablet TAKE 1 TABLET BY MOUTH 2 TIMES A WEEK 25 tablet 2   vitamin C (ASCORBIC ACID) 500 MG tablet Take 500 mg by mouth daily.     No facility-administered medications prior to visit.    Review of Systems;  Patient denies headache, fevers, malaise, unintentional weight loss, skin rash, eye pain, sinus congestion and sinus pain, sore throat, dysphagia,  hemoptysis , cough, dyspnea, wheezing, chest pain, palpitations,  orthopnea, edema, abdominal pain, nausea, melena, diarrhea, constipation, flank pain, dysuria, hematuria, urinary  Frequency, nocturia, numbness, tingling, seizures,  Focal weakness, Loss of consciousness,  Tremor, insomnia, depression, anxiety, and suicidal ideation.      Objective:  BP (!) 144/66   Pulse 73   Ht 5\' 5"  (1.651 m)   Wt 140 lb (63.5 kg)   SpO2 98%   BMI 23.30 kg/m   BP Readings from Last 3 Encounters:  10/26/23 (!) 144/66  06/01/23 128/78  12/16/22 100/66    Wt Readings from Last 3 Encounters:  10/26/23 140 lb (63.5 kg)  06/01/23 142 lb 9.6 oz (64.7 kg)  01/16/23 138 lb (62.6 kg)    Physical Exam  Lab Results  Component Value Date   HGBA1C 5.4 06/01/2023   HGBA1C 5.5 08/26/2018    Lab Results  Component Value Date   CREATININE 0.73 06/01/2023   CREATININE 0.73 04/01/2023   CREATININE 0.66 09/03/2022    Lab Results  Component Value Date   WBC 6.2 09/03/2022   HGB 13.1 09/03/2022   HCT 38.5 09/03/2022   PLT 302.0 09/03/2022   GLUCOSE 95 06/01/2023   CHOL 148 06/01/2023   TRIG 136.0 06/01/2023   HDL 56.80 06/01/2023   LDLDIRECT 70.0 06/01/2023   LDLCALC 64 06/01/2023   ALT  14 06/01/2023   AST 21 06/01/2023   NA 139 06/01/2023   K 3.8 06/01/2023   CL 103 06/01/2023   CREATININE 0.73 06/01/2023   BUN 13 06/01/2023   CO2 29 06/01/2023   TSH 1.01 06/01/2023   HGBA1C 5.4 06/01/2023   MICROALBUR <0.7 06/01/2023    US THYROID Result Date: 05/25/2023 CLINICAL DATA:  Prior ultrasound follow-up. EXAM: THYROID ULTRASOUND TECHNIQUE: Ultrasound examination of the thyroid gland and adjacent soft tissues was performed. COMPARISON:  08/22/2013, 10/10/2013 FINDINGS: Parenchymal Echotexture: Moderately heterogeneous Isthmus: 1.5 cm ,previously 0.3 cm Right lobe: 6.1 x 2.1 x 2.8 cm ,previously 5.6 x 2.1 x 2.3 cm Left lobe: 9.5 x 4.2 x 3.7 cm ,previously 6.7 x 2.6 x 2.3 cm ________________________________________________________ Estimated total number of  nodules >/= 1 cm: 2 Number of spongiform nodules >/=  2 cm not described below (TR1): 0 Number of mixed cystic and solid nodules >/= 1.5 cm not described below (TR2): 0 _________________________________________________________ Similar benign appearing spongiform nodule in the right superior thyroid measuring up to 0.7 cm, previously 1.2 cm. Slight interval increased size of previously biopsied right mid solid thyroid nodule (labeled 2, 5.1 cm, previously 4.1 cm). Interval enlargement of previously biopsied left mid solid thyroid nodule (labeled 3, 8.6 cm, previously 3.0 cm). No cervical lymphadenopathy. IMPRESSION: 1. Similar appearing multinodular thyroid. 2. Interval enlargement of previously biopsied right mid (labeled 2, 5.1 cm, previously 4.1 cm) and left mid (labeled 3, 8.6 cm, previously 3.0 cm) thyroid nodules. Recommend correlation with prior biopsy results. The above is in keeping with the ACR TI-RADS recommendations - J Am Coll Radiol 2017;14:587-595. Marliss Coots, MD [facility] Radiology Electronically Signed   By: Marliss Coots M.D.   On: 05/25/2023 10:50    Assessment & Plan:  .There are no diagnoses linked to this encounter.   I spent 34 minutes on the day of this face to face encounter reviewing patient's  most recent visit with cardiology,  nephrology,  and neurology,  prior relevant surgical and non surgical procedures, recent  labs and imaging studies, counseling on weight management,  reviewing the assessment and plan with patient, and post visit ordering and reviewing of  diagnostics and therapeutics with patient  .   Follow-up: No follow-ups on file.   Sherlene Shams, MD

## 2023-10-27 DIAGNOSIS — S92406A Nondisplaced unspecified fracture of unspecified great toe, initial encounter for closed fracture: Secondary | ICD-10-CM | POA: Insufficient documentation

## 2023-10-27 DIAGNOSIS — Z9181 History of falling: Secondary | ICD-10-CM | POA: Insufficient documentation

## 2023-10-27 LAB — COMPREHENSIVE METABOLIC PANEL WITH GFR
ALT: 13 U/L (ref 0–35)
AST: 18 U/L (ref 0–37)
Albumin: 4.4 g/dL (ref 3.5–5.2)
Alkaline Phosphatase: 57 U/L (ref 39–117)
BUN: 12 mg/dL (ref 6–23)
CO2: 28 meq/L (ref 19–32)
Calcium: 10.2 mg/dL (ref 8.4–10.5)
Chloride: 105 meq/L (ref 96–112)
Creatinine, Ser: 0.7 mg/dL (ref 0.40–1.20)
GFR: 75.88 mL/min (ref >60.00)
Glucose, Bld: 62 mg/dL — ABNORMAL LOW (ref 70–99)
Potassium: 3.7 meq/L (ref 3.5–5.1)
Sodium: 141 meq/L (ref 135–145)
Total Bilirubin: 0.5 mg/dL (ref 0.2–1.2)
Total Protein: 6.8 g/dL (ref 6.0–8.3)

## 2023-10-27 NOTE — Assessment & Plan Note (Signed)
 Well controlled on low dose amlodipine now that foot pain has resolved.  . Renal function stable, no changes today.

## 2023-10-27 NOTE — Assessment & Plan Note (Signed)
 Occurred at home after pivoting and reversing direction suddenly, resultling in fracture of left great toe.  Neurologic exam is normal. Today .  She declines PT

## 2023-10-27 NOTE — Assessment & Plan Note (Signed)
 Repeat ultrasound Oct 2024 notes increased size of both lobes and increased size of previously biopsied nodule. Patient states that she was referred to enodcrinology bby ENT for unclear reasons, but  has not been contacted yet.  Records from Dr Andee Poles requested

## 2023-10-27 NOTE — Assessment & Plan Note (Signed)
 Reviewed findings of prior CT scan today..  Patient is tolerating high potency statin  20 mg crestor  Lab Results  Component Value Date   CHOL 148 06/01/2023   HDL 56.80 06/01/2023   LDLCALC 64 06/01/2023   LDLDIRECT 70.0 06/01/2023   TRIG 136.0 06/01/2023   CHOLHDL 3 06/01/2023   Lab Results  Component Value Date   ALT 14 06/01/2023   AST 21 06/01/2023   ALKPHOS 62 06/01/2023   BILITOT 0.5 06/01/2023

## 2023-10-27 NOTE — Assessment & Plan Note (Signed)
 Occurred during a fall several months ago.  Podiatry managed with a hard sole shoe

## 2023-10-27 NOTE — Assessment & Plan Note (Signed)
 Secondary to hyperparathyroidism.  Repeat level is normal Lab Results  Component Value Date   NA 139 06/01/2023   K 3.8 06/01/2023   CL 103 06/01/2023   CO2 29 06/01/2023   Lab Results  Component Value Date   CREATININE 0.73 06/01/2023

## 2023-11-10 DIAGNOSIS — H6121 Impacted cerumen, right ear: Secondary | ICD-10-CM | POA: Diagnosis not present

## 2023-11-10 DIAGNOSIS — E213 Hyperparathyroidism, unspecified: Secondary | ICD-10-CM | POA: Diagnosis not present

## 2023-11-10 DIAGNOSIS — E042 Nontoxic multinodular goiter: Secondary | ICD-10-CM | POA: Diagnosis not present

## 2023-11-20 DIAGNOSIS — L84 Corns and callosities: Secondary | ICD-10-CM | POA: Diagnosis not present

## 2023-11-20 DIAGNOSIS — M2042 Other hammer toe(s) (acquired), left foot: Secondary | ICD-10-CM | POA: Diagnosis not present

## 2023-12-22 ENCOUNTER — Other Ambulatory Visit: Payer: Self-pay | Admitting: Internal Medicine

## 2023-12-22 DIAGNOSIS — Z1231 Encounter for screening mammogram for malignant neoplasm of breast: Secondary | ICD-10-CM

## 2024-01-08 DIAGNOSIS — E042 Nontoxic multinodular goiter: Secondary | ICD-10-CM | POA: Diagnosis not present

## 2024-01-08 DIAGNOSIS — M85832 Other specified disorders of bone density and structure, left forearm: Secondary | ICD-10-CM | POA: Diagnosis not present

## 2024-01-08 DIAGNOSIS — E210 Primary hyperparathyroidism: Secondary | ICD-10-CM | POA: Diagnosis not present

## 2024-01-08 DIAGNOSIS — Z1331 Encounter for screening for depression: Secondary | ICD-10-CM | POA: Diagnosis not present

## 2024-01-12 ENCOUNTER — Ambulatory Visit
Admission: RE | Admit: 2024-01-12 | Discharge: 2024-01-12 | Disposition: A | Discharge status: Home / Self Care | Source: Ambulatory Visit | Attending: Internal Medicine | Admitting: Internal Medicine

## 2024-01-12 DIAGNOSIS — Z1231 Encounter for screening mammogram for malignant neoplasm of breast: Secondary | ICD-10-CM | POA: Insufficient documentation

## 2024-01-23 ENCOUNTER — Other Ambulatory Visit: Payer: Self-pay | Admitting: Internal Medicine

## 2024-02-10 ENCOUNTER — Emergency Department
Admission: EM | Admit: 2024-02-10 | Discharge: 2024-02-10 | Disposition: A | Discharge status: Home / Self Care | Source: Ambulatory Visit | Attending: Emergency Medicine | Admitting: Emergency Medicine

## 2024-02-10 ENCOUNTER — Ambulatory Visit: Payer: Self-pay

## 2024-02-10 ENCOUNTER — Telehealth: Payer: Self-pay

## 2024-02-10 ENCOUNTER — Emergency Department: Attending: Cardiovascular Disease

## 2024-02-10 ENCOUNTER — Other Ambulatory Visit: Payer: Self-pay

## 2024-02-10 DIAGNOSIS — R000 Tachycardia, unspecified: Secondary | ICD-10-CM

## 2024-02-10 DIAGNOSIS — R002 Palpitations: Secondary | ICD-10-CM | POA: Diagnosis not present

## 2024-02-10 DIAGNOSIS — R7989 Other specified abnormal findings of blood chemistry: Secondary | ICD-10-CM | POA: Insufficient documentation

## 2024-02-10 LAB — TROPONIN I (HIGH SENSITIVITY)
Troponin I (High Sensitivity): 24 ng/L — ABNORMAL HIGH (ref <18)
Troponin I (High Sensitivity): 26 ng/L — ABNORMAL HIGH (ref <18)

## 2024-02-10 LAB — CBC WITH DIFFERENTIAL/PLATELET
Abs Immature Granulocytes: 0.02 K/uL (ref 0.00–0.07)
Basophils Absolute: 0 K/uL (ref 0.0–0.1)
Basophils Relative: 1 %
Eosinophils Absolute: 0.1 K/uL (ref 0.0–0.5)
Eosinophils Relative: 1 %
HCT: 38.3 % (ref 36.0–46.0)
Hemoglobin: 12.6 g/dL (ref 12.0–15.0)
Immature Granulocytes: 0 %
Lymphocytes Relative: 24 %
Lymphs Abs: 1.6 K/uL (ref 0.7–4.0)
MCH: 32.1 pg (ref 26.0–34.0)
MCHC: 32.9 g/dL (ref 30.0–36.0)
MCV: 97.7 fL (ref 80.0–100.0)
Monocytes Absolute: 0.5 K/uL (ref 0.1–1.0)
Monocytes Relative: 8 %
Neutro Abs: 4.6 K/uL (ref 1.7–7.7)
Neutrophils Relative %: 66 %
Platelets: 258 K/uL (ref 150–400)
RBC: 3.92 MIL/uL (ref 3.87–5.11)
RDW: 12.5 % (ref 11.5–15.5)
WBC: 6.8 K/uL (ref 4.0–10.5)
nRBC: 0 % (ref 0.0–0.2)

## 2024-02-10 LAB — BASIC METABOLIC PANEL WITH GFR
Anion gap: 11 (ref 5–15)
BUN: 14 mg/dL (ref 8–23)
CO2: 25 mmol/L (ref 22–32)
Calcium: 10.2 mg/dL (ref 8.9–10.3)
Chloride: 105 mmol/L (ref 98–111)
Creatinine, Ser: 0.74 mg/dL (ref 0.44–1.00)
GFR, Estimated: >60 mL/min (ref >60)
Glucose, Bld: 150 mg/dL — ABNORMAL HIGH (ref 70–99)
Potassium: 4.3 mmol/L (ref 3.5–5.1)
Sodium: 141 mmol/L (ref 135–145)

## 2024-02-10 NOTE — Telephone Encounter (Signed)
 Copied from CRM [id number]. Topic: Clinical - Red Word Triage >> Feb 10, 2024 11:58 AM Mia F wrote: Red Word that prompted transfer to Nurse Triage: Pt is concerned because yesterday her blood pressure was 120/82 and pulse 132 she checked it again and got  112/77 pulse 131. She is concerned about the pulse being high. She is on bp meds. She said she's never had a reading like this before.  Call connection issues with patient and the agent. This RN attempted to call patient back-no answer. Will place in call backs.

## 2024-02-10 NOTE — ED Provider Notes (Signed)
 [facility] Provider Note    Event Date/Time   First MD Initiated Contact with Patient 02/10/24 1404     (approximate)  History   Chief Complaint: Tachycardia  HPI  Bronte BENNETTE HASTY is a 89 y.o. female with a past medical history of gastric reflux, presents to the emergency department for fast heart rate.  According to the patient she checks her blood pressure every night before going to bed.  States she checked her blood pressure last night and the blood pressure was normal around 120 but the pulse rate read 132.  Patient states this has happened before, however she has never got this worked up.  Patient states she decided to come to the emergency department for evaluation.  Patient states she was unaware of any heart racing or palpitation sensation.  Denies any chest pain or shortness of breath at any point.  Patient does states she has been under increased stress recently.  Physical Exam   Triage Vital Signs: ED Triage Vitals [02/10/24 1356]  Encounter Vitals Group     BP 132/77     Girls Systolic BP Percentile      Girls Diastolic BP Percentile      Boys Systolic BP Percentile      Boys Diastolic BP Percentile      Pulse Rate 86     Resp 16     Temp 98.2 F (36.8 C)     Temp Source Oral     SpO2 100 %     Weight 140 lb (63.5 kg)     Height 5' 5 (1.651 m)     Head Circumference      Peak Flow      Pain Score 0     Pain Loc      Pain Education      Exclude from Growth Chart     Most recent vital signs: Vitals:   02/10/24 1356  BP: 132/77  Pulse: 86  Resp: 16  Temp: 98.2 F (36.8 C)  SpO2: 100%    General: Awake, no distress.  CV:  Good peripheral perfusion.  Regular rate and rhythm around 70-80 beats per minute Resp:  Normal effort.  Equal breath sounds bilaterally.  Abd:  No distention.  Soft, nontender. Other:  No significant lower extreme edema   ED Results / Procedures / Treatments   EKG  EKG viewed and interpreted by  myself shows a normal sinus rhythm at 70 bpm with a narrow QRS, left axis deviation, largely normal intervals with nonspecific ST changes.  MEDICATIONS ORDERED IN ED: Medications - No data to display   IMPRESSION / MDM / ASSESSMENT AND PLAN / ED COURSE  I reviewed the triage vital signs and the nursing notes.  Patient's presentation is most consistent with acute presentation with potential threat to life or bodily function.  Patient presents emergency department for elevated heart rate last night.  Patient states she was checking her blood pressure as she normally does every night and noted that the heart rate was in the 130s.  Patient denies any chest pain denies any shortness of breath.  Patient states that has happened before but she never got it worked up.  Patient came to the emergency department today for evaluation.  Denies any sensation of heart racing or palpitations at any point.  Patient's lab work today shows a reassuring CBC.  Patient's chemistry shows no significant findings.  Troponin is slightly elevated at 24.  Will repeat  a troponin after 2 hours.  If the troponin has not changed I believe the patient would be safe for discharge home with outpatient follow-up with a cardiologist.  Will discuss with cardiology to help expedite follow-up.  I suspect the patient could be intermittently going into a tacky arrhythmia such as SVT, atrial fibrillation/flutter.  Currently in normal sinus rhythm.  I spoke with Dr. Perla of cardiology who will arrange a 2-week Holter monitor for the patient.  Updated the patient regarding this and she is agreeable.  Will repeat a troponin as a precaution as long as there is no major change in the troponin level anticipate discharge home with outpatient follow-up with cardiology.  Patient agreeable to plan.  Repeat troponin is largely unchanged.  Will discharge patient home with follow-up with cardiology for a Holter monitor.  I have already spoke to Dr.  Perla  FINAL CLINICAL IMPRESSION(S) / ED DIAGNOSES   Tachycardia    Note:  This document was prepared using Dragon voice recognition software and may include unintentional dictation errors.   Dorothyann Drivers, MD 02/10/24 1731

## 2024-02-10 NOTE — Discharge Instructions (Signed)
 You have been seen in the emergency department today for rapid heart rate.  Your workup has shown normal results.  Your heart rate is currently normal.  I spoke to cardiology they will be sending a Holter/heart monitor in the mail to you, with instructions on how to place it on your chest.  He will follow-up with you in the office.  Please call the number provided if you have any questions, and to make a follow-up appointment.

## 2024-02-10 NOTE — ED Notes (Signed)
 Fall precautions in place for Pt. This RN placed fall band, fall grip shoes worn, bed alarm and fall sign.

## 2024-02-10 NOTE — Telephone Encounter (Signed)
 14 day zio monitor order placed as requested by Dr. Gollan.

## 2024-02-10 NOTE — ED Triage Notes (Signed)
 Pt presents after having an episode of tachycardia last night (130s).  Pt denies chest pain or SOB during episode.  Pt reports she has been under a significant amount of stress.    Pt currently has no complaints.

## 2024-02-10 NOTE — Telephone Encounter (Signed)
 FYI Only or Action Required?: FYI only for provider.  Patient was last seen in primary care on 10/26/2023 by Marylynn Verneita CROME, MD.  Called Nurse Triage reporting No chief complaint on file..  Symptoms began yesterday.  Interventions attempted: Nothing.  Symptoms are: completely resolved.  Triage Disposition: See HCP Within 4 Hours (Or PCP Triage) - see note, going to UC  Patient/caregiver understands and will follow disposition?:   Reason for Disposition  Age > 60 years  (Exception: Brief heartbeat symptoms that went away and now feels well.)  Answer Assessment - Initial Assessment Questions Soonest available appt 02/15/24. This RN educated patient on the importance of calling 911 if her heart rate becomes elevated again so that they can check an EKG while it is occurring and because it can be a medical emergency, dizziness, weakness, chest pain, SOB. Patient verbalized understanding.  This RN called PCP CAL line at [phone] and spoke with [name] - they do not have any availability to fit her in today, patient was advised to go to urgent care. She stated that she will go to [facility] in [place].  1. DESCRIPTION: Please describe your heart rate or heartbeat that you are having (e.g., fast/slow, regular/irregular, skipped or extra beats, palpitations)      States that she noticed her pulse was elevated when she checked her BP yesterday 02/08/22 - pulse was 132 and 131 on different monitors. Current BP 119/64 and pulse 79. Denies any symptoms when she was tachycardic including dizziness, weakness, chest pain, SOB or any other symptoms.  2. ONSET: When did it start? (e.g., minutes, hours, days)      02/09/24  3. DURATION: How long does it last (e.g., seconds, minutes, hours)     Rechecked while on the phone with this RN and pulse was 79  9. CARDIAC HISTORY: Do you have any history of heart disease? (e.g., heart attack, angina, bypass surgery, angioplasty, arrhythmia)       Denies  Protocols used: Heart Rate and Heartbeat Questions-A-AH

## 2024-02-15 ENCOUNTER — Encounter: Payer: Self-pay | Admitting: Internal Medicine

## 2024-02-15 ENCOUNTER — Ambulatory Visit (INDEPENDENT_AMBULATORY_CARE_PROVIDER_SITE_OTHER): Admitting: Internal Medicine

## 2024-02-15 VITALS — BP 118/76 | HR 70 | Temp 98.1°F | Ht 65.0 in | Wt 142.4 lb

## 2024-02-15 DIAGNOSIS — R000 Tachycardia, unspecified: Secondary | ICD-10-CM | POA: Insufficient documentation

## 2024-02-15 DIAGNOSIS — I10 Essential (primary) hypertension: Secondary | ICD-10-CM

## 2024-02-15 NOTE — Patient Instructions (Signed)
-   It was a pleasure meeting you today -I am glad that your symptoms have now resolved.  If you do get recurrent palpitations or develop chest pain or lightheadedness or passout please follow-up in the emergency room urgently -We will help you place the Zio patch on you to help monitor your heart rhythm.  This will be seen by the cardiologist and they will contact you if there is any concerning findings -Please contact us  if you have any questions or concerns

## 2024-02-15 NOTE — Assessment & Plan Note (Signed)
-   This problem is chronic and stable -Blood pressure today is well-controlled at 124/80 -Will continue with amlodipine  2.5 mg daily -Patient to follow-up with her PCP as needed -No further workup at this time

## 2024-02-15 NOTE — Progress Notes (Signed)
 Acute Office Visit  Subjective:     Patient ID: Bridget [name], female    DOB: 04-16-1931, 89 y.o.   MRN: [id number]  Chief Complaint  Patient presents with   Acute Visit    Tachycardic last night has resovled    HPI Patient is in today for follow-up for emergency room visit.  Patient states that approximately 6 months ago she had a couple of episodes palpitations after waking up at night and going to the bathroom and coming back to bed.  States that she has not had any symptoms like that for the last 6 months.  Last week, she was seen her blood pressure night and was noted to have a heart rate of 132 on her monitor.  She also states that she had a feeling like she needed to belch but was unable to.  She went to the emergency room for follow-up and workup was reassuring.  Patient states that she got her heart monitor in the mail but was unsure of how to place it.  Today, patient feels well.  No palpitations or lightheadedness or syncope.  No chest pain.  Patient does complain of some increased stress related to her sister (lives in a facility).  Review of Systems  Constitutional: Negative.   HENT: Negative.    Respiratory: Negative.    Cardiovascular: Negative.  Negative for chest pain and palpitations.  Musculoskeletal: Negative.   Neurological: Negative.  Negative for dizziness and loss of consciousness.  Psychiatric/Behavioral: Negative.          Objective:    BP 118/76   Pulse 70   Temp 98.1 F (36.7 C)   Ht 5' 5 (1.651 m)   Wt 142 lb 6.4 oz (64.6 kg)   SpO2 98%   BMI 23.70 kg/m    Physical Exam Constitutional:      Appearance: Normal appearance.  HENT:     Head: Normocephalic and atraumatic.  Cardiovascular:     Rate and Rhythm: Normal rate and regular rhythm.     Heart sounds: Normal heart sounds.  Pulmonary:     Breath sounds: Normal breath sounds. No wheezing or rales.  Abdominal:     General: Bowel sounds are normal. There is no distension.      Palpations: Abdomen is soft.     Tenderness: There is no abdominal tenderness.  Neurological:     General: No focal deficit present.     Mental Status: She is alert and oriented to person, place, and time.  Psychiatric:        Mood and Affect: Mood normal.        Behavior: Behavior normal.     No results found for any visits on 02/15/24.      Assessment & Plan:   Problem List Items Addressed This Visit       Cardiovascular and Mediastinum   Essential hypertension   - This problem is chronic and stable -Blood pressure today is well-controlled at 124/80 -Will continue with amlodipine  2.5 mg daily -Patient to follow-up with her PCP as needed -No further workup at this time        Other   Tachycardia - Primary   - Patient had an episode last week where she noted her heart rate was 132 on her monitor (while checking blood pressure).  She also had a feeling like she needed to belch but was unable to -She went to the emergency room for further evaluation but the episode of tachycardia  had resolved. -I reviewed the workup in the emergency room including EKG (normal sinus rhythm with no acute ST/T wave changes), troponins (max troponin of 26), CMP and CBC. -Case was discussed with cardiology by ED attending.  Cardiac monitor was mailed to patient's house -Patient was uncertain how to put the cardiac monitor on.  Elizabeth (CMA) will assist patient in placing the monitor -No further workup at this time -Return precautions given to the patient       No orders of the defined types were placed in this encounter.   No follow-ups on file.  Nyree Applegate, MD

## 2024-02-15 NOTE — Assessment & Plan Note (Signed)
-   Patient had an episode last week where she noted her heart rate was 132 on her monitor (while checking blood pressure).  She also had a feeling like she needed to belch but was unable to -She went to the emergency room for further evaluation but the episode of tachycardia had resolved. -I reviewed the workup in the emergency room including EKG (normal sinus rhythm with no acute ST/T wave changes), troponins (max troponin of 26), CMP and CBC. -Case was discussed with cardiology by ED attending.  Cardiac monitor was mailed to patient's house -Patient was uncertain how to put the cardiac monitor on.  Elizabeth (CMA) will assist patient in placing the monitor -No further workup at this time -Return precautions given to the patient

## 2024-02-23 ENCOUNTER — Other Ambulatory Visit: Payer: Self-pay | Admitting: Internal Medicine

## 2024-02-24 ENCOUNTER — Other Ambulatory Visit: Payer: Self-pay | Admitting: Internal Medicine

## 2024-03-09 DIAGNOSIS — R000 Tachycardia, unspecified: Secondary | ICD-10-CM | POA: Diagnosis not present

## 2024-03-18 ENCOUNTER — Ambulatory Visit: Attending: Cardiovascular Disease | Admitting: Cardiovascular Disease

## 2024-03-18 ENCOUNTER — Encounter: Payer: Self-pay | Admitting: Cardiovascular Disease

## 2024-03-18 VITALS — BP 120/76 | HR 74 | Ht 64.0 in | Wt 141.0 lb

## 2024-03-18 DIAGNOSIS — I444 Left anterior fascicular block: Secondary | ICD-10-CM

## 2024-03-18 DIAGNOSIS — I491 Atrial premature depolarization: Secondary | ICD-10-CM | POA: Diagnosis not present

## 2024-03-18 DIAGNOSIS — R000 Tachycardia, unspecified: Secondary | ICD-10-CM

## 2024-03-18 DIAGNOSIS — R0609 Other forms of dyspnea: Secondary | ICD-10-CM

## 2024-03-18 MED ORDER — METOPROLOL SUCCINATE ER 25 MG PO TB24: 12.5000 mg | ORAL_TABLET | Freq: Every day | ORAL | 3 refills | Status: DC

## 2024-03-18 NOTE — Patient Instructions (Addendum)
 Medication Instructions:   Please start metoprolol  succinate 12.5 mg daily  If you need a refill on your cardiac medications before your next appointment, please call your pharmacy.   Lab work: No new labs needed  Testing/Procedures: No new testing needed  Follow-Up: At [facility], you and your health needs are our priority.  As part of our continuing mission to provide you with exceptional heart care, we have created designated Provider Care Teams.  These Care Teams include your primary Cardiologist (physician) and Advanced Practice Providers (APPs -  Physician Assistants and Nurse Practitioners) who all work together to provide you with the care you need, when you need it.  You will need a follow up appointment as needed  Providers on your designated Care Team:   Bridget Meager, Bridget [name] Bridget Bring, Bridget [name] Bridget [name], [place]  COVID-19 Vaccine Information can be found at: [email or web address] For questions related to vaccine distribution or appointments, please email [email or web address] or call [phone].

## 2024-03-18 NOTE — Progress Notes (Signed)
 Cardiology Office Note  Date:  03/18/2024   ID:  Bridget [name] 06-04-1931, MRN [id number]  PCP:  Bridget Verneita CROME, MD   Chief Complaint  Patient presents with   New patient    Presents after emergency room visit for tachycardia . patient c/o shortness of breath with exertion.     HPI:  Bridget [name] a 89 y.o. femalewith past medical history of: GERD Chest pain Who presents by referral from Dr. Tullo for shortness of breath, tachycardia  On discussion today reports that she feels well Lives at home alone, son helps her with ADLs  Seen in the emergency room February 10, 2024  Was checking blood pressure at home and blood pressure cuff read pulse 132 bpm Reported it had happened before she decided to go to the emergency room She was asymptomatic  She reports that she is asymptomatic from any tachycardic episodes, denies any near-syncope or syncope  Zio monitor ordered with results as below min HR of 44 bpm, max HR of 190 bpm, and avg HR of 72 bpm. Predominantly sinus Rhythm.  1 run of Ventricular Tachycardia occurred lasting 4 beats with a max rate of 160 bpm (avg 116 bpm).  309 Supraventricular Tachycardia runs, fastest interval lasting 5 beats with a max rate of 190 bpm, the longest lasting 16.2 secs with an avg rate of 129 bpm.  Isolated SVEs were occasional (2.5%, 35145), SVE Couplets were rare (<1.0%, 2738), and SVE Triplets  were rare (<1.0%, 851).  Isolated VEs were occasional (2.4%, 34196), VE Couplets were rare (<1.0%, 1070), and VE Triplets were rare (<1.0%, 107). Ventricular Bigeminy and Trigeminy were present.  EKG personally reviewed by myself on todays visit EKG Interpretation Date/Time:  Friday March 18 2024 15:06:00 EDT Ventricular Rate:  74 PR Interval:  162 QRS Duration:  78 QT Interval:  396 QTC Calculation: 439 R Axis:   -43  Text Interpretation: Normal sinus rhythm Left axis deviation Moderate voltage criteria for LVH, may be normal variant ( R  in aVL , Cornell product ) When compared with ECG of 18-Mar-2024 15:01, Premature atrial complexes are no longer Present Confirmed by Bridget [name] [phone]) on 03/18/2024 3:26:24 PM    PMH:   has a past medical history of Diverticulosis of colon without diverticulitis and GERD (gastroesophageal reflux disease).  PSH:    Past Surgical History:  Procedure Laterality Date   ABDOMINAL HYSTERECTOMY     Not due to cancer, due to fibroids, BSO   OOPHORECTOMY     Bilateral   TOE SURGERY  06/2015   R Big toe Ingrown toenail removed      Current Outpatient Medications  Medication Sig Dispense Refill   acetaminophen  (TYLENOL ) 500 MG tablet Take 1,000 mg by mouth daily as needed.     amLODipine  (NORVASC ) 2.5 MG tablet TAKE 1 TABLET BY MOUTH ONCE DAILY 90 tablet 1   cholecalciferol (VITAMIN D3) 25 MCG (1000 UNIT) tablet Take 1,000 Units by mouth daily.     Multiple Vitamins-Minerals (CENTRUM SILVER PO) Take 1 tablet by mouth daily.       pantoprazole  (PROTONIX ) 40 MG tablet Take 1 tablet (40 mg total) by mouth daily. 90 tablet 1   rosuvastatin  (CRESTOR ) 10 MG tablet TAKE 1 TABLET BY MOUTH 2 TIMES A WEEK 25 tablet 2   vitamin C (ASCORBIC ACID) 500 MG tablet Take 500 mg by mouth daily.     No current facility-administered medications for this visit.    Allergies:  Sulfa antibiotics   Social History:  The patient  reports that she has never smoked. She has never used smokeless tobacco. She reports that she does not drink alcohol and does not use drugs.   Family History:   family history includes Cancer in her brother, brother, sister, sister, and sister; Heart failure in her mother.    Review of Systems: Review of Systems  Constitutional: Negative.   HENT: Negative.    Respiratory: Negative.    Cardiovascular: Negative.   Gastrointestinal: Negative.   Musculoskeletal: Negative.   Neurological: Negative.   Psychiatric/Behavioral: Negative.    All other systems reviewed and are  negative.   PHYSICAL EXAM: VS:  BP 120/76 (BP Location: Left Arm, Patient Position: Sitting, Cuff Size: Normal)   Pulse 74   Ht 5' 4 (1.626 m)   Wt 141 lb (64 kg)   SpO2 97%   BMI 24.20 kg/m  , BMI Body mass index is 24.2 kg/m. GEN: Well nourished, well developed, in no acute distress HEENT: normal Neck: no JVD, carotid bruits, or masses Cardiac: RRR; no murmurs, rubs, or gallops,no edema  Respiratory:  clear to auscultation bilaterally, normal work of breathing GI: soft, nontender, nondistended, + BS MS: no deformity or atrophy Skin: warm and dry, no rash Neuro:  Strength and sensation are intact Psych: euthymic mood, full affect   Recent Labs: 06/01/2023: TSH 1.01 10/26/2023: ALT 13 02/10/2024: BUN 14; Creatinine, Ser 0.74; Hemoglobin 12.6; Platelets 258; Potassium 4.3; Sodium 141    Lipid Panel Lab Results  Component Value Date   CHOL 148 06/01/2023   HDL 56.80 06/01/2023   LDLCALC 64 06/01/2023   TRIG 136.0 06/01/2023      Wt Readings from Last 3 Encounters:  03/18/24 141 lb (64 kg)  02/15/24 142 lb 6.4 oz (64.6 kg)  02/10/24 140 lb (63.5 kg)      ASSESSMENT AND PLAN:  Problem List Items Addressed This Visit     Tachycardia - Primary   Relevant Orders   EKG 12-Lead (Completed)   Other Visit Diagnoses       Dyspnea on exertion       Relevant Orders   EKG 12-Lead (Completed)      Paroxysmal SVT Asymptomatic, arrhythmia noted on monitoring of pulse rate at home using her blood pressure cuff Very short runs of tachycardia noted on Zio monitor, asymptomatic Discussed first treatment options, we have suggested she start metoprolol  succinate 12.5 daily with consideration of increasing dose up to 25 mg daily if blood pressure tolerates - Normal clinical exam and EKG, will hold off on echocardiogram - Given asymptomatic nature of arrhythmia, can hold off on antiarrhythmic medications and referral to EP  Essential hypertension Currently on amlodipine  2.5  daily Started on metoprolol  succinate 12.5 daily as above And follow-up with Dr. Tullo could consider continuing  regiment above, if needed could increase metoprolol  succinate up to 25 daily, may need to hold amlodipine  if blood pressure runs low    Signed, Bridget [name], M.D., Ph.D. [facility] [place], [place] [phone]

## 2024-03-22 DIAGNOSIS — R000 Tachycardia, unspecified: Secondary | ICD-10-CM | POA: Diagnosis not present

## 2024-03-26 ENCOUNTER — Ambulatory Visit: Payer: Self-pay | Admitting: Cardiovascular Disease

## 2024-04-18 DIAGNOSIS — Z961 Presence of intraocular lens: Secondary | ICD-10-CM | POA: Diagnosis not present

## 2024-04-27 ENCOUNTER — Ambulatory Visit (INDEPENDENT_AMBULATORY_CARE_PROVIDER_SITE_OTHER): Admitting: Internal Medicine

## 2024-04-27 ENCOUNTER — Encounter: Payer: Self-pay | Admitting: Internal Medicine

## 2024-04-27 VITALS — BP 136/66 | HR 71 | Ht 64.0 in | Wt 143.0 lb

## 2024-04-27 DIAGNOSIS — Z515 Encounter for palliative care: Secondary | ICD-10-CM

## 2024-04-27 DIAGNOSIS — Z0000 Encounter for general adult medical examination without abnormal findings: Secondary | ICD-10-CM

## 2024-04-27 DIAGNOSIS — R000 Tachycardia, unspecified: Secondary | ICD-10-CM | POA: Diagnosis not present

## 2024-04-27 DIAGNOSIS — R5383 Other fatigue: Secondary | ICD-10-CM

## 2024-04-27 DIAGNOSIS — E042 Nontoxic multinodular goiter: Secondary | ICD-10-CM | POA: Diagnosis not present

## 2024-04-27 DIAGNOSIS — E7800 Pure hypercholesterolemia, unspecified: Secondary | ICD-10-CM

## 2024-04-27 DIAGNOSIS — Z0001 Encounter for general adult medical examination with abnormal findings: Secondary | ICD-10-CM

## 2024-04-27 DIAGNOSIS — R7301 Impaired fasting glucose: Secondary | ICD-10-CM | POA: Diagnosis not present

## 2024-04-27 DIAGNOSIS — I10 Essential (primary) hypertension: Secondary | ICD-10-CM

## 2024-04-27 DIAGNOSIS — I700 Atherosclerosis of aorta: Secondary | ICD-10-CM

## 2024-04-27 DIAGNOSIS — I708 Atherosclerosis of other arteries: Secondary | ICD-10-CM

## 2024-04-27 LAB — LDL CHOLESTEROL, DIRECT: Direct LDL: 80 mg/dL

## 2024-04-27 LAB — CBC WITH DIFFERENTIAL/PLATELET
Basophils Absolute: 0.1 K/uL (ref 0.0–0.1)
Basophils Relative: 1.1 % (ref 0.0–3.0)
Eosinophils Absolute: 0.1 K/uL (ref 0.0–0.7)
Eosinophils Relative: 2.5 % (ref 0.0–5.0)
HCT: 38.3 % (ref 36.0–46.0)
Hemoglobin: 12.6 g/dL (ref 12.0–15.0)
Lymphocytes Relative: 25.9 % (ref 12.0–46.0)
Lymphs Abs: 1.4 K/uL (ref 0.7–4.0)
MCHC: 33 g/dL (ref 30.0–36.0)
MCV: 96.2 fl (ref 78.0–100.0)
Monocytes Absolute: 0.4 K/uL (ref 0.1–1.0)
Monocytes Relative: 7.4 % (ref 3.0–12.0)
Neutro Abs: 3.5 K/uL (ref 1.4–7.7)
Neutrophils Relative %: 63.1 % (ref 43.0–77.0)
Platelets: 260 K/uL (ref 150.0–400.0)
RBC: 3.98 Mil/uL (ref 3.87–5.11)
RDW: 13.2 % (ref 11.5–15.5)
WBC: 5.5 K/uL (ref 4.0–10.5)

## 2024-04-27 LAB — COMPREHENSIVE METABOLIC PANEL WITH GFR
ALT: 10 U/L (ref 0–35)
AST: 16 U/L (ref 0–37)
Albumin: 4.7 g/dL (ref 3.5–5.2)
Alkaline Phosphatase: 50 U/L (ref 39–117)
BUN: 15 mg/dL (ref 6–23)
CO2: 32 meq/L (ref 19–32)
Calcium: 10.2 mg/dL (ref 8.4–10.5)
Chloride: 103 meq/L (ref 96–112)
Creatinine, Ser: 0.73 mg/dL (ref 0.40–1.20)
GFR: 71.9 mL/min (ref >60.00)
Glucose, Bld: 95 mg/dL (ref 70–99)
Potassium: 3.8 meq/L (ref 3.5–5.1)
Sodium: 143 meq/L (ref 135–145)
Total Bilirubin: 0.5 mg/dL (ref 0.2–1.2)
Total Protein: 6.8 g/dL (ref 6.0–8.3)

## 2024-04-27 LAB — LIPID PANEL
Cholesterol: 155 mg/dL (ref 0–200)
HDL: 59.3 mg/dL (ref >39.00)
LDL Cholesterol: 72 mg/dL (ref 0–99)
NonHDL: 96.13
Total CHOL/HDL Ratio: 3
Triglycerides: 123 mg/dL (ref 0.0–149.0)
VLDL: 24.6 mg/dL (ref 0.0–40.0)

## 2024-04-27 LAB — TSH: TSH: 0.86 u[IU]/mL (ref 0.35–5.50)

## 2024-04-27 MED ORDER — PANTOPRAZOLE SODIUM 40 MG PO TBEC: 40.0000 mg | DELAYED_RELEASE_TABLET | Freq: Every day | ORAL | 1 refills | Status: AC

## 2024-04-27 MED ORDER — PANTOPRAZOLE SODIUM 40 MG PO TBEC: 40.0000 mg | DELAYED_RELEASE_TABLET | Freq: Every day | ORAL | 1 refills | Status: DC

## 2024-04-27 MED ORDER — ROSUVASTATIN CALCIUM 10 MG PO TABS: ORAL_TABLET | ORAL | 2 refills | Status: AC

## 2024-04-27 MED ORDER — ROSUVASTATIN CALCIUM 10 MG PO TABS: ORAL_TABLET | ORAL | 2 refills | Status: DC

## 2024-04-27 NOTE — Patient Instructions (Addendum)
 YOu can  continue to soak foot followed by gentle pumice stone  action  on the callous    You can find 20% urea cream OTC for your callous    You can take 3000 mg of acetominophen (tylenol ) every day safely  In divided doses ( 1000 mg every 8 hours.)

## 2024-04-27 NOTE — Progress Notes (Unsigned)
 Patient ID: Bridget [name], female    DOB: 12-15-1930  Age: 89 y.o. MRN: [id number]  The patient is here for annual preventive examination and management of other chronic and acute problems.   The risk factors are reflected in the social history.  The roster of all physicians providing medical care to patient - is listed in the Snapshot section of the chart.  Activities of daily living:  The patient is 100% independent in all ADLs: dressing, toileting, feeding as well as independent mobility  Home safety : The patient has smoke detectors in the home. They wear seatbelts.  There are no firearms at home. There is no violence in the home.   There is no risks for hepatitis, STDs or HIV. There is no   history of blood transfusion. They have no travel history to infectious disease endemic areas of the world.  The patient has seen their dentist in the last six month. They have seen their eye doctor in the last year. They admit to slight hearing difficulty with regard to whispered voices and some television programs.  They have deferred audiologic testing in the last year.  They do not  have excessive sun exposure. Discussed the need for sun protection: hats, long sleeves and use of sunscreen if there is significant sun exposure.   Diet: the importance of a healthy diet is discussed. They do have a healthy diet.  The benefits of regular aerobic exercise were discussed. She walks 5 times per week ,  20 minutes.   Depression screen: there are no signs or vegative symptoms of depression- irritability, change in appetite, anhedonia, sadness/tearfullness.  Cognitive assessment: the patient manages all their financial and personal affairs and is actively engaged. They could relate day,date,year and events; recalled 2/3 objects at 3 minutes; performed clock-face test normally.  The following portions of the patient's history were reviewed and updated as appropriate: allergies, current medications, past  family history, past medical history,  past surgical history, past social history  and problem list.  Visual acuity was not assessed per patient preference since she has regular follow up with her ophthalmologist. Hearing and body mass index were assessed and reviewed.   During the course of the visit the patient was educated and counseled about appropriate screening and preventive services including : fall prevention , diabetes screening, nutrition counseling, colorectal cancer screening, and recommended immunizations.    CC: The primary encounter diagnosis was Essential hypertension. Diagnoses of Pure hypercholesterolemia, Impaired fasting glucose, and Other fatigue were also pertinent to this visit.   1) SVT:  taking metoprolol  12.5 mg since August 29  2) HTN: taking amlodipine    3) OA leg  4) saw podiatry for callous on foot,  given an expensive cream    History Bridget [name] has a past medical history of Diverticulosis of colon without diverticulitis and GERD (gastroesophageal reflux disease).   She has a past surgical history that includes Oophorectomy; Abdominal hysterectomy; and Toe Surgery (06/2015).   Her family history includes Cancer in her brother, brother, sister, sister, and sister; Heart failure in her mother.She reports that she has never smoked. She has never used smokeless tobacco. She reports that she does not drink alcohol and does not use drugs.  Outpatient Medications Prior to Visit  Medication Sig Dispense Refill  . acetaminophen  (TYLENOL ) 500 MG tablet Take 1,000 mg by mouth daily as needed.    . amLODipine  (NORVASC ) 2.5 MG tablet TAKE 1 TABLET BY MOUTH ONCE DAILY 90 tablet 1  .  cholecalciferol (VITAMIN D3) 25 MCG (1000 UNIT) tablet Take 1,000 Units by mouth daily.    . metoprolol  succinate (TOPROL -XL) 25 MG 24 hr tablet Take 0.5 tablets (12.5 mg total) by mouth daily. Take with or immediately following a meal. 90 tablet 3  . Multiple Vitamins-Minerals (CENTRUM SILVER  PO) Take 1 tablet by mouth daily.      . pantoprazole  (PROTONIX ) 40 MG tablet Take 1 tablet (40 mg total) by mouth daily. 90 tablet 1  . rosuvastatin  (CRESTOR ) 10 MG tablet TAKE 1 TABLET BY MOUTH 2 TIMES A WEEK 25 tablet 2  . vitamin C (ASCORBIC ACID) 500 MG tablet Take 500 mg by mouth daily.     No facility-administered medications prior to visit.    Review of Systems  Patient denies headache, fevers, malaise, unintentional weight loss, skin rash, eye pain, sinus congestion and sinus pain, sore throat, dysphagia,  hemoptysis , cough, dyspnea, wheezing, chest pain, palpitations, orthopnea, edema, abdominal pain, nausea, melena, diarrhea, constipation, flank pain, dysuria, hematuria, urinary  Frequency, nocturia, numbness, tingling, seizures,  Focal weakness, Loss of consciousness,  Tremor, insomnia, depression, anxiety, and suicidal ideation.     Objective:  There were no vitals taken for this visit.  Physical Exam Vitals reviewed.  Constitutional:      General: She is not in acute distress.    Appearance: Normal appearance. She is well-developed and normal weight. She is not ill-appearing, toxic-appearing or diaphoretic.  HENT:     Head: Normocephalic.     Right Ear: Tympanic membrane, ear canal and external ear normal. There is no impacted cerumen.     Left Ear: Tympanic membrane, ear canal and external ear normal. There is no impacted cerumen.     Nose: Nose normal.     Mouth/Throat:     Mouth: Mucous membranes are moist.     Pharynx: Oropharynx is clear.  Eyes:     General: No scleral icterus.       Right eye: No discharge.        Left eye: No discharge.     Conjunctiva/sclera: Conjunctivae normal.     Pupils: Pupils are equal, round, and reactive to light.  Neck:     Thyroid : No thyromegaly.     Vascular: No carotid bruit or JVD.  Cardiovascular:     Rate and Rhythm: Normal rate and regular rhythm.     Heart sounds: Normal heart sounds.  Pulmonary:     Effort: Pulmonary  effort is normal. No respiratory distress.     Breath sounds: Normal breath sounds.  Chest:  Breasts:    Breasts are symmetrical.     Right: Normal. No swelling, inverted nipple, mass, nipple discharge, skin change or tenderness.     Left: Normal. No swelling, inverted nipple, mass, nipple discharge, skin change or tenderness.  Abdominal:     General: Bowel sounds are normal.     Palpations: Abdomen is soft. There is no mass.     Tenderness: There is no abdominal tenderness. There is no guarding or rebound.  Musculoskeletal:        General: Normal range of motion.     Cervical back: Normal range of motion and neck supple.  Lymphadenopathy:     Cervical: No cervical adenopathy.     Upper Body:     Right upper body: No supraclavicular, axillary or pectoral adenopathy.     Left upper body: No supraclavicular, axillary or pectoral adenopathy.  Skin:    General: Skin is warm  and dry.  Neurological:     General: No focal deficit present.     Mental Status: She is alert and oriented to person, place, and time. Mental status is at baseline.  Psychiatric:        Mood and Affect: Mood normal.        Behavior: Behavior normal.        Thought Content: Thought content normal.        Judgment: Judgment normal.      Assessment & Plan:  Essential hypertension  Pure hypercholesterolemia  Impaired fasting glucose  Other fatigue      I provided 40 minutes of  face-to-face time during this encounter reviewing patient's current problems and past surgeries,  recent labs and imaging studies, providing counseling on the above mentioned problems , and coordination  of care .   Follow-up: No follow-ups on file.   Bridget LITTIE Kettering, MD

## 2024-04-28 DIAGNOSIS — K080 Exfoliation of teeth due to systemic causes: Secondary | ICD-10-CM | POA: Diagnosis not present

## 2024-04-29 ENCOUNTER — Ambulatory Visit: Payer: Self-pay | Admitting: Internal Medicine

## 2024-04-29 DIAGNOSIS — Z0000 Encounter for general adult medical examination without abnormal findings: Secondary | ICD-10-CM | POA: Insufficient documentation

## 2024-04-29 NOTE — Assessment & Plan Note (Addendum)
 Repeat ultrasound Oct 2024 notes increased size of both lobes and increased size of previously biopsied nodule. PerDr Solum (Jume 2025 visit) has h/o bilateral thyroid  nodules which were biopsied in 2015 and resulted benign. Her last thyroid  US  was completed on 05/14/2023 and was compared to US  from 2015. This was remarkable for a 5.1 cm solid right mid nodule and and 8.6 cm solid left mid nodule that have grown during this time. She is following with ENT for this and reports they did not recommend any surgical intervention at her age. She states they are monitoring her nodules for now. She denies any dysphagia, odynophagia, difficulty breathing or changes in voice.   Lab Results  Component Value Date   TSH 0.86 04/27/2024

## 2024-04-29 NOTE — Assessment & Plan Note (Signed)
 Reviewed findings of prior CT scan today..  Patient is tolerating high potency statin  20 mg crestor   Lab Results  Component Value Date   CHOL 155 04/27/2024   HDL 59.30 04/27/2024   LDLCALC 72 04/27/2024   LDLDIRECT 80.0 04/27/2024   TRIG 123.0 04/27/2024   CHOLHDL 3 04/27/2024   Lab Results  Component Value Date   ALT 10 04/27/2024   AST 16 04/27/2024   ALKPHOS 50 04/27/2024   BILITOT 0.5 04/27/2024

## 2024-04-29 NOTE — Assessment & Plan Note (Signed)

## 2024-04-29 NOTE — Assessment & Plan Note (Signed)
 Secondary to hyperparathyroidism.  Repeat level is normal follow up annually with [facility]  Lab Results  Component Value Date   CALCIUM  10.2 04/27/2024

## 2024-04-29 NOTE — Assessment & Plan Note (Signed)
 Controlled with low dose metoprolol .  Noninvasive workup reviewed.

## 2024-04-29 NOTE — Assessment & Plan Note (Signed)
During the course of the visit , End of Life objectives were discussed at length,  Patient does not have a living will in place or a healthcare power of attorney.  She was given printed information about advance directives and encouraged to return after discussing with her family,

## 2024-04-29 NOTE — Assessment & Plan Note (Signed)
 Well controlled on low dose amlodipine  and metoprolol    . Renal function stable, no changes today.  Lab Results  Component Value Date   CREATININE 0.73 04/27/2024   Lab Results  Component Value Date   NA 143 04/27/2024   K 3.8 04/27/2024   CL 103 04/27/2024   CO2 32 04/27/2024

## 2024-05-10 DIAGNOSIS — E042 Nontoxic multinodular goiter: Secondary | ICD-10-CM | POA: Diagnosis not present

## 2024-05-10 DIAGNOSIS — E213 Hyperparathyroidism, unspecified: Secondary | ICD-10-CM | POA: Diagnosis not present

## 2024-05-30 ENCOUNTER — Other Ambulatory Visit: Payer: Self-pay

## 2024-05-30 ENCOUNTER — Telehealth: Payer: Self-pay | Admitting: Internal Medicine

## 2024-05-30 MED ORDER — AMLODIPINE BESYLATE 2.5 MG PO TABS: 2.5000 mg | ORAL_TABLET | Freq: Every day | ORAL | 1 refills | Status: AC

## 2024-05-30 MED ORDER — METOPROLOL SUCCINATE ER 25 MG PO TB24: 12.5000 mg | ORAL_TABLET | Freq: Every day | ORAL | 3 refills | Status: AC

## 2024-05-30 NOTE — Telephone Encounter (Signed)
 Copied from CRM #[id number]. Topic: Clinical - Medication Refill >> May 30, 2024  8:28 AM [name] ORN wrote: Medication: amLODipine  (NORVASC ) 2.5 MG tablet, metoprolol  succinate (TOPROL -XL) 25 MG 24 hr tablet  Has the patient contacted their pharmacy? Yes (Agent: If no, request that the patient contact the pharmacy for the refill. If patient does not wish to contact the pharmacy document the reason why and proceed with request.) (Agent: If yes, when and what did the pharmacy advise?)  This is the patient's preferred pharmacy:  [facility] [address] [place] - [address] [place] [address] Phone: [phone] Fax: [phone]  Is this the correct pharmacy for this prescription? Yes If no, delete pharmacy and type the correct one.   Has the prescription been filled recently? No  Is the patient out of the medication? Yes  Has the patient been seen for an appointment in the last year OR does the patient have an upcoming appointment? Yes  Can we respond through MyChart? No  Agent: Please be advised that Rx refills may take up to 3 business days. We ask that you follow-up with your pharmacy.

## 2024-05-30 NOTE — Telephone Encounter (Signed)
 Medication has been refilled.

## 2024-06-29 DIAGNOSIS — Z03818 Encounter for observation for suspected exposure to other biological agents ruled out: Secondary | ICD-10-CM | POA: Diagnosis not present

## 2024-06-29 DIAGNOSIS — I499 Cardiac arrhythmia, unspecified: Secondary | ICD-10-CM | POA: Diagnosis not present

## 2024-06-29 DIAGNOSIS — J069 Acute upper respiratory infection, unspecified: Secondary | ICD-10-CM | POA: Diagnosis not present

## 2024-06-29 DIAGNOSIS — J029 Acute pharyngitis, unspecified: Secondary | ICD-10-CM | POA: Diagnosis not present

## 2024-10-27 ENCOUNTER — Ambulatory Visit: Admitting: Internal Medicine
